# Patient Record
Sex: Male | Born: 1970 | Race: White | Hispanic: No | Marital: Single | Smoking: Current every day smoker
Health system: Southern US, Community
[De-identification: ages and names within clinical notes are randomized; demographics above are authoritative.]

## PROBLEM LIST (undated history)

## (undated) DIAGNOSIS — I10 Essential (primary) hypertension: Secondary | ICD-10-CM

## (undated) DIAGNOSIS — N200 Calculus of kidney: Secondary | ICD-10-CM

## (undated) HISTORY — PX: APPENDECTOMY: SHX54

---

## 2006-09-03 ENCOUNTER — Emergency Department: Payer: Self-pay | Admitting: Emergency Medicine

## 2006-10-16 ENCOUNTER — Emergency Department: Payer: Self-pay | Admitting: Internal Medicine

## 2009-01-27 ENCOUNTER — Emergency Department (HOSPITAL_COMMUNITY): Admission: EM | Admit: 2009-01-27 | Discharge: 2009-01-27 | Payer: Self-pay | Admitting: Emergency Medicine

## 2010-05-17 ENCOUNTER — Emergency Department (HOSPITAL_COMMUNITY): Admission: EM | Admit: 2010-05-17 | Discharge: 2010-05-17 | Payer: Self-pay | Admitting: Emergency Medicine

## 2010-08-21 ENCOUNTER — Emergency Department (HOSPITAL_COMMUNITY)
Admission: EM | Admit: 2010-08-21 | Discharge: 2010-08-21 | Payer: Self-pay | Source: Home / Self Care | Admitting: Emergency Medicine

## 2010-09-13 ENCOUNTER — Emergency Department (HOSPITAL_COMMUNITY)
Admission: EM | Admit: 2010-09-13 | Discharge: 2010-09-13 | Payer: Self-pay | Source: Home / Self Care | Admitting: Emergency Medicine

## 2010-09-20 LAB — URINALYSIS, ROUTINE W REFLEX MICROSCOPIC
Bilirubin Urine: NEGATIVE
Ketones, ur: NEGATIVE mg/dL
Leukocytes, UA: NEGATIVE
Nitrite: NEGATIVE
Protein, ur: NEGATIVE mg/dL
Specific Gravity, Urine: 1.025 (ref 1.005–1.030)
Urine Glucose, Fasting: NEGATIVE mg/dL
Urobilinogen, UA: 0.2 mg/dL (ref 0.0–1.0)
pH: 6 (ref 5.0–8.0)

## 2010-09-20 LAB — URINE MICROSCOPIC-ADD ON

## 2010-12-14 LAB — CBC
HCT: 43 % (ref 39.0–52.0)
Hemoglobin: 15.5 g/dL (ref 13.0–17.0)
MCHC: 36.1 g/dL — ABNORMAL HIGH (ref 30.0–36.0)
MCV: 94.3 fL (ref 78.0–100.0)
Platelets: 182 10*3/uL (ref 150–400)
RBC: 4.56 MIL/uL (ref 4.22–5.81)
RDW: 13.7 % (ref 11.5–15.5)
WBC: 7.5 10*3/uL (ref 4.0–10.5)

## 2010-12-14 LAB — DIFFERENTIAL
Basophils Absolute: 0 10*3/uL (ref 0.0–0.1)
Basophils Relative: 0 % (ref 0–1)
Eosinophils Absolute: 0.3 10*3/uL (ref 0.0–0.7)
Eosinophils Relative: 4 % (ref 0–5)
Lymphocytes Relative: 24 % (ref 12–46)
Lymphs Abs: 1.8 10*3/uL (ref 0.7–4.0)
Monocytes Absolute: 0.6 10*3/uL (ref 0.1–1.0)
Monocytes Relative: 8 % (ref 3–12)
Neutro Abs: 4.8 10*3/uL (ref 1.7–7.7)
Neutrophils Relative %: 64 % (ref 43–77)

## 2010-12-14 LAB — COMPREHENSIVE METABOLIC PANEL
ALT: 79 U/L — ABNORMAL HIGH (ref 0–53)
AST: 46 U/L — ABNORMAL HIGH (ref 0–37)
Albumin: 3.8 g/dL (ref 3.5–5.2)
Alkaline Phosphatase: 57 U/L (ref 39–117)
BUN: 10 mg/dL (ref 6–23)
CO2: 29 mEq/L (ref 19–32)
Calcium: 9.1 mg/dL (ref 8.4–10.5)
Chloride: 107 mEq/L (ref 96–112)
Creatinine, Ser: 0.86 mg/dL (ref 0.4–1.5)
GFR calc Af Amer: >60 mL/min (ref >60)
GFR calc non Af Amer: >60 mL/min (ref >60)
Glucose, Bld: 79 mg/dL (ref 70–99)
Potassium: 3.9 mEq/L (ref 3.5–5.1)
Sodium: 142 mEq/L (ref 135–145)
Total Bilirubin: 0.4 mg/dL (ref 0.3–1.2)
Total Protein: 6.5 g/dL (ref 6.0–8.3)

## 2010-12-14 LAB — POCT CARDIAC MARKERS
CKMB, poc: <1 ng/mL — ABNORMAL LOW (ref 1.0–8.0)
Myoglobin, poc: 42.1 ng/mL (ref 12–200)
Troponin i, poc: <0.05 ng/mL (ref 0.00–0.09)

## 2011-01-05 ENCOUNTER — Emergency Department (HOSPITAL_COMMUNITY): Payer: Self-pay

## 2011-01-05 ENCOUNTER — Emergency Department (HOSPITAL_COMMUNITY)
Admission: EM | Admit: 2011-01-05 | Discharge: 2011-01-05 | Disposition: A | Discharge status: Home / Self Care | Payer: Self-pay | Attending: Emergency Medicine | Admitting: Emergency Medicine

## 2011-01-05 DIAGNOSIS — M542 Cervicalgia: Secondary | ICD-10-CM | POA: Insufficient documentation

## 2011-01-05 DIAGNOSIS — Z87442 Personal history of urinary calculi: Secondary | ICD-10-CM | POA: Insufficient documentation

## 2011-07-27 ENCOUNTER — Ambulatory Visit (HOSPITAL_COMMUNITY)
Admission: RE | Admit: 2011-07-27 | Discharge: 2011-07-27 | Disposition: A | Discharge status: Home / Self Care | Payer: BC Managed Care – PPO | Source: Ambulatory Visit | Attending: Internal Medicine | Admitting: Internal Medicine

## 2011-07-27 ENCOUNTER — Other Ambulatory Visit (HOSPITAL_COMMUNITY): Payer: Self-pay | Admitting: Internal Medicine

## 2011-07-27 DIAGNOSIS — M549 Dorsalgia, unspecified: Secondary | ICD-10-CM

## 2011-07-27 DIAGNOSIS — M546 Pain in thoracic spine: Secondary | ICD-10-CM | POA: Insufficient documentation

## 2011-07-27 DIAGNOSIS — M545 Low back pain, unspecified: Secondary | ICD-10-CM | POA: Insufficient documentation

## 2012-02-22 ENCOUNTER — Emergency Department: Payer: Self-pay | Admitting: Emergency Medicine

## 2012-02-22 LAB — CBC
HCT: 45.9 % (ref 40.0–52.0)
HGB: 15.4 g/dL (ref 13.0–18.0)
MCH: 32.3 pg (ref 26.0–34.0)
MCHC: 33.7 g/dL (ref 32.0–36.0)
MCV: 96 fL (ref 80–100)
Platelet: 192 10*3/uL (ref 150–440)
RBC: 4.78 10*6/uL (ref 4.40–5.90)
RDW: 13.5 % (ref 11.5–14.5)
WBC: 7.5 10*3/uL (ref 3.8–10.6)

## 2012-02-22 LAB — BASIC METABOLIC PANEL
Anion Gap: 7 (ref 7–16)
BUN: 17 mg/dL (ref 7–18)
Calcium, Total: 9 mg/dL (ref 8.5–10.1)
Chloride: 110 mmol/L — ABNORMAL HIGH (ref 98–107)
Co2: 25 mmol/L (ref 21–32)
Creatinine: 0.7 mg/dL (ref 0.60–1.30)
EGFR (African American): >60
EGFR (Non-African Amer.): >60
Glucose: 89 mg/dL (ref 65–99)
Osmolality: 284 (ref 275–301)
Potassium: 3.6 mmol/L (ref 3.5–5.1)
Sodium: 142 mmol/L (ref 136–145)

## 2012-02-22 LAB — TROPONIN I: Troponin-I: <0.02 ng/mL

## 2012-02-22 LAB — CK TOTAL AND CKMB (NOT AT ARMC)
CK, Total: 98 U/L (ref 35–232)
CK-MB: 1 ng/mL (ref 0.5–3.6)

## 2012-05-06 ENCOUNTER — Emergency Department: Payer: Self-pay | Admitting: Emergency Medicine

## 2012-05-11 ENCOUNTER — Emergency Department: Payer: Self-pay | Admitting: Emergency Medicine

## 2012-11-25 ENCOUNTER — Emergency Department: Payer: Self-pay | Admitting: Unknown Physician Specialty

## 2013-01-25 ENCOUNTER — Emergency Department: Payer: Self-pay | Admitting: Emergency Medicine

## 2013-01-28 ENCOUNTER — Emergency Department: Payer: Self-pay | Admitting: Emergency Medicine

## 2013-01-28 LAB — COMPREHENSIVE METABOLIC PANEL
Albumin: 3.6 g/dL (ref 3.4–5.0)
Alkaline Phosphatase: 74 U/L (ref 50–136)
Anion Gap: 6 — ABNORMAL LOW (ref 7–16)
BUN: 14 mg/dL (ref 7–18)
Bilirubin,Total: 0.1 mg/dL — ABNORMAL LOW (ref 0.2–1.0)
Calcium, Total: 8.5 mg/dL (ref 8.5–10.1)
Chloride: 111 mmol/L — ABNORMAL HIGH (ref 98–107)
Co2: 27 mmol/L (ref 21–32)
Creatinine: 0.72 mg/dL (ref 0.60–1.30)
EGFR (African American): >60
EGFR (Non-African Amer.): >60
Glucose: 90 mg/dL (ref 65–99)
Osmolality: 287 (ref 275–301)
Potassium: 3.7 mmol/L (ref 3.5–5.1)
SGOT(AST): 35 U/L (ref 15–37)
SGPT (ALT): 60 U/L (ref 12–78)
Sodium: 144 mmol/L (ref 136–145)
Total Protein: 6.5 g/dL (ref 6.4–8.2)

## 2013-01-28 LAB — CBC
HCT: 44.1 % (ref 40.0–52.0)
HGB: 15.3 g/dL (ref 13.0–18.0)
MCH: 32 pg (ref 26.0–34.0)
MCHC: 34.7 g/dL (ref 32.0–36.0)
MCV: 93 fL (ref 80–100)
Platelet: 194 10*3/uL (ref 150–440)
RBC: 4.77 10*6/uL (ref 4.40–5.90)
RDW: 13.3 % (ref 11.5–14.5)
WBC: 8.1 10*3/uL (ref 3.8–10.6)

## 2013-01-28 LAB — URINALYSIS, COMPLETE
Bacteria: NONE SEEN
Bilirubin,UR: NEGATIVE
Glucose,UR: NEGATIVE mg/dL (ref 0–75)
Ketone: NEGATIVE
Leukocyte Esterase: NEGATIVE
Nitrite: NEGATIVE
Ph: 6 (ref 4.5–8.0)
Protein: NEGATIVE
RBC,UR: 2 /HPF (ref 0–5)
Specific Gravity: 1.005 (ref 1.003–1.030)
Squamous Epithelial: NONE SEEN
WBC UR: NONE SEEN /HPF (ref 0–5)

## 2013-05-26 ENCOUNTER — Emergency Department: Payer: Self-pay | Admitting: Emergency Medicine

## 2013-05-26 LAB — URINALYSIS, COMPLETE
Bacteria: NONE SEEN
Bilirubin,UR: NEGATIVE
Glucose,UR: NEGATIVE mg/dL (ref 0–75)
Ketone: NEGATIVE
Leukocyte Esterase: NEGATIVE
Nitrite: NEGATIVE
Ph: 7 (ref 4.5–8.0)
Protein: NEGATIVE
RBC,UR: 18 /HPF (ref 0–5)
Specific Gravity: 1.011 (ref 1.003–1.030)
Squamous Epithelial: NONE SEEN
WBC UR: 1 /HPF (ref 0–5)

## 2013-05-26 LAB — COMPREHENSIVE METABOLIC PANEL
Albumin: 4 g/dL (ref 3.4–5.0)
Alkaline Phosphatase: 71 U/L (ref 50–136)
Anion Gap: 6 — ABNORMAL LOW (ref 7–16)
BUN: 14 mg/dL (ref 7–18)
Bilirubin,Total: 0.2 mg/dL (ref 0.2–1.0)
Calcium, Total: 8.9 mg/dL (ref 8.5–10.1)
Chloride: 108 mmol/L — ABNORMAL HIGH (ref 98–107)
Co2: 26 mmol/L (ref 21–32)
Creatinine: 0.85 mg/dL (ref 0.60–1.30)
EGFR (African American): >60
EGFR (Non-African Amer.): >60
Glucose: 112 mg/dL — ABNORMAL HIGH (ref 65–99)
Osmolality: 281 (ref 275–301)
Potassium: 3.2 mmol/L — ABNORMAL LOW (ref 3.5–5.1)
SGOT(AST): 21 U/L (ref 15–37)
SGPT (ALT): 27 U/L (ref 12–78)
Sodium: 140 mmol/L (ref 136–145)
Total Protein: 6.8 g/dL (ref 6.4–8.2)

## 2013-05-26 LAB — CBC
HCT: 44.6 % (ref 40.0–52.0)
HGB: 15.8 g/dL (ref 13.0–18.0)
MCH: 33.2 pg (ref 26.0–34.0)
MCHC: 35.4 g/dL (ref 32.0–36.0)
MCV: 94 fL (ref 80–100)
Platelet: 183 10*3/uL (ref 150–440)
RBC: 4.75 10*6/uL (ref 4.40–5.90)
RDW: 13.5 % (ref 11.5–14.5)
WBC: 10.7 10*3/uL — ABNORMAL HIGH (ref 3.8–10.6)

## 2013-05-26 LAB — LIPASE, BLOOD: Lipase: 191 U/L (ref 73–393)

## 2013-12-08 ENCOUNTER — Emergency Department: Payer: Self-pay | Admitting: Emergency Medicine

## 2013-12-08 LAB — CBC WITH DIFFERENTIAL/PLATELET
Basophil #: 0 10*3/uL (ref 0.0–0.1)
Basophil %: 0.7 %
Eosinophil #: 0.5 10*3/uL (ref 0.0–0.7)
Eosinophil %: 6.5 %
HCT: 44 % (ref 40.0–52.0)
HGB: 14.9 g/dL (ref 13.0–18.0)
Lymphocyte #: 1.9 10*3/uL (ref 1.0–3.6)
Lymphocyte %: 27.1 %
MCH: 31.7 pg (ref 26.0–34.0)
MCHC: 34 g/dL (ref 32.0–36.0)
MCV: 94 fL (ref 80–100)
Monocyte #: 0.8 x10 3/mm (ref 0.2–1.0)
Monocyte %: 11.7 %
Neutrophil #: 3.8 10*3/uL (ref 1.4–6.5)
Neutrophil %: 54 %
Platelet: 177 10*3/uL (ref 150–440)
RBC: 4.71 10*6/uL (ref 4.40–5.90)
RDW: 13.3 % (ref 11.5–14.5)
WBC: 7.1 10*3/uL (ref 3.8–10.6)

## 2013-12-08 LAB — URINALYSIS, COMPLETE
Bacteria: NONE SEEN
Bilirubin,UR: NEGATIVE
Glucose,UR: NEGATIVE mg/dL (ref 0–75)
Ketone: NEGATIVE
Leukocyte Esterase: NEGATIVE
Nitrite: NEGATIVE
Ph: 7 (ref 4.5–8.0)
Protein: NEGATIVE
RBC,UR: 14 /HPF (ref 0–5)
Specific Gravity: 1.01 (ref 1.003–1.030)
Squamous Epithelial: NONE SEEN
WBC UR: <1 /HPF (ref 0–5)

## 2013-12-08 LAB — TROPONIN I: Troponin-I: <0.02 ng/mL

## 2013-12-08 LAB — BASIC METABOLIC PANEL
Anion Gap: 6 — ABNORMAL LOW (ref 7–16)
BUN: 12 mg/dL (ref 7–18)
Calcium, Total: 8.4 mg/dL — ABNORMAL LOW (ref 8.5–10.1)
Chloride: 107 mmol/L (ref 98–107)
Co2: 28 mmol/L (ref 21–32)
Creatinine: 0.86 mg/dL (ref 0.60–1.30)
EGFR (African American): >60
EGFR (Non-African Amer.): >60
Glucose: 86 mg/dL (ref 65–99)
Osmolality: 280 (ref 275–301)
Potassium: 3.7 mmol/L (ref 3.5–5.1)
Sodium: 141 mmol/L (ref 136–145)

## 2013-12-23 ENCOUNTER — Emergency Department: Payer: Self-pay | Admitting: Emergency Medicine

## 2013-12-23 LAB — BASIC METABOLIC PANEL
Anion Gap: 5 — ABNORMAL LOW (ref 7–16)
BUN: 16 mg/dL (ref 7–18)
Calcium, Total: 8.2 mg/dL — ABNORMAL LOW (ref 8.5–10.1)
Chloride: 111 mmol/L — ABNORMAL HIGH (ref 98–107)
Co2: 26 mmol/L (ref 21–32)
Creatinine: 0.73 mg/dL (ref 0.60–1.30)
EGFR (African American): >60
EGFR (Non-African Amer.): >60
Glucose: 97 mg/dL (ref 65–99)
Osmolality: 284 (ref 275–301)
Potassium: 3.6 mmol/L (ref 3.5–5.1)
Sodium: 142 mmol/L (ref 136–145)

## 2013-12-23 LAB — URINALYSIS, COMPLETE
Bacteria: NONE SEEN
Bilirubin,UR: NEGATIVE
Glucose,UR: NEGATIVE mg/dL (ref 0–75)
Ketone: NEGATIVE
Leukocyte Esterase: NEGATIVE
Nitrite: NEGATIVE
Ph: 5 (ref 4.5–8.0)
Protein: NEGATIVE
RBC,UR: 19 /HPF (ref 0–5)
Specific Gravity: 1.016 (ref 1.003–1.030)
Squamous Epithelial: NONE SEEN
WBC UR: <1 /HPF (ref 0–5)

## 2013-12-23 LAB — DRUG SCREEN, URINE

## 2013-12-23 LAB — CBC WITH DIFFERENTIAL/PLATELET
Basophil #: 0 10*3/uL (ref 0.0–0.1)
Basophil %: 0.3 %
Eosinophil #: 0.4 10*3/uL (ref 0.0–0.7)
Eosinophil %: 2.8 %
HCT: 46.9 % (ref 40.0–52.0)
HGB: 15.5 g/dL (ref 13.0–18.0)
Lymphocyte #: 3.4 10*3/uL (ref 1.0–3.6)
Lymphocyte %: 26 %
MCH: 30.9 pg (ref 26.0–34.0)
MCHC: 32.9 g/dL (ref 32.0–36.0)
MCV: 94 fL (ref 80–100)
Monocyte #: 1 x10 3/mm (ref 0.2–1.0)
Monocyte %: 7.8 %
Neutrophil #: 8.2 10*3/uL — ABNORMAL HIGH (ref 1.4–6.5)
Neutrophil %: 63.1 %
Platelet: 224 10*3/uL (ref 150–440)
RBC: 5 10*6/uL (ref 4.40–5.90)
RDW: 13.4 % (ref 11.5–14.5)
WBC: 13 10*3/uL — ABNORMAL HIGH (ref 3.8–10.6)

## 2013-12-23 LAB — TROPONIN I: Troponin-I: <0.02 ng/mL

## 2013-12-23 LAB — CK: CK, Total: 44 U/L

## 2014-09-23 ENCOUNTER — Emergency Department: Payer: Self-pay | Admitting: Emergency Medicine

## 2014-10-15 ENCOUNTER — Emergency Department: Payer: Self-pay | Admitting: Emergency Medicine

## 2014-10-29 ENCOUNTER — Emergency Department: Payer: Self-pay | Admitting: Emergency Medicine

## 2014-12-04 ENCOUNTER — Emergency Department: Admit: 2014-12-04 | Disposition: A | Discharge status: Home / Self Care | Payer: Self-pay | Admitting: Student

## 2014-12-04 LAB — BASIC METABOLIC PANEL
Anion Gap: 7 (ref 7–16)
BUN: 14 mg/dL
Calcium, Total: 8.9 mg/dL
Chloride: 105 mmol/L
Co2: 24 mmol/L
Creatinine: 0.93 mg/dL
EGFR (African American): >60
EGFR (Non-African Amer.): >60
Glucose: 94 mg/dL
Potassium: 3.4 mmol/L — ABNORMAL LOW
Sodium: 136 mmol/L

## 2014-12-04 LAB — CBC
HCT: 45.9 % (ref 40.0–52.0)
HGB: 15.9 g/dL (ref 13.0–18.0)
MCH: 32.2 pg (ref 26.0–34.0)
MCHC: 34.7 g/dL (ref 32.0–36.0)
MCV: 93 fL (ref 80–100)
Platelet: 197 10*3/uL (ref 150–440)
RBC: 4.94 10*6/uL (ref 4.40–5.90)
RDW: 13.4 % (ref 11.5–14.5)
WBC: 8.6 10*3/uL (ref 3.8–10.6)

## 2014-12-04 LAB — TROPONIN I: Troponin-I: <0.03 ng/mL

## 2015-02-26 ENCOUNTER — Emergency Department: Payer: BLUE CROSS/BLUE SHIELD

## 2015-02-26 ENCOUNTER — Emergency Department
Admission: EM | Admit: 2015-02-26 | Discharge: 2015-02-26 | Disposition: A | Discharge status: Home / Self Care | Payer: BLUE CROSS/BLUE SHIELD | Attending: Emergency Medicine | Admitting: Emergency Medicine

## 2015-02-26 ENCOUNTER — Encounter: Payer: Self-pay | Admitting: *Deleted

## 2015-02-26 DIAGNOSIS — S8981XA Other specified injuries of right lower leg, initial encounter: Secondary | ICD-10-CM

## 2015-02-26 DIAGNOSIS — Y998 Other external cause status: Secondary | ICD-10-CM | POA: Insufficient documentation

## 2015-02-26 DIAGNOSIS — S8991XA Unspecified injury of right lower leg, initial encounter: Secondary | ICD-10-CM | POA: Insufficient documentation

## 2015-02-26 DIAGNOSIS — Y9389 Activity, other specified: Secondary | ICD-10-CM | POA: Diagnosis not present

## 2015-02-26 DIAGNOSIS — W1789XA Other fall from one level to another, initial encounter: Secondary | ICD-10-CM | POA: Diagnosis not present

## 2015-02-26 DIAGNOSIS — Y9289 Other specified places as the place of occurrence of the external cause: Secondary | ICD-10-CM | POA: Insufficient documentation

## 2015-02-26 MED ORDER — TRAMADOL HCL 50 MG PO TABS
ORAL_TABLET | ORAL | Status: AC
  Filled 2015-02-26: qty 1

## 2015-02-26 MED ORDER — IBUPROFEN 800 MG PO TABS
ORAL_TABLET | ORAL | Status: DC
Start: 2015-02-26 — End: 2015-02-26
  Filled 2015-02-26: qty 1

## 2015-02-26 MED ORDER — TRAMADOL HCL 50 MG PO TABS
50.0000 mg | ORAL_TABLET | Freq: Once | ORAL | Status: AC
  Administered 2015-02-26: 50 mg via ORAL

## 2015-02-26 MED ORDER — IBUPROFEN 800 MG PO TABS: 800.0000 mg | ORAL_TABLET | Freq: Three times a day (TID) | ORAL | Status: DC | PRN

## 2015-02-26 MED ORDER — TRAMADOL HCL 50 MG PO TABS: 50.0000 mg | ORAL_TABLET | Freq: Four times a day (QID) | ORAL | Status: DC | PRN

## 2015-02-26 MED ORDER — IBUPROFEN 800 MG PO TABS
800.0000 mg | ORAL_TABLET | Freq: Once | ORAL | Status: AC
  Administered 2015-02-26: 800 mg via ORAL

## 2015-02-26 NOTE — ED Provider Notes (Signed)
[name] Emergency Department Provider Note  ____________________________________________  Time seen: Approximately 7:24 PM  I have reviewed the triage vital signs and the nursing notes.   HISTORY  Chief Complaint Knee Pain    HPI Douglas Barton is a 44 y.o. male patient complained of right knee pain secondary falling 5 feet from a landing. Patient stated he felt like the knee hyperextended and felt a popping sensation. Patient states pain with extension of the knee. Patient rates his pain at 6/10 upon extension of the knee. Able to bear weight. No palliative measures except for icing immediately after the incident..  History reviewed. No pertinent past medical history.  There are no active problems to display for this patient.   Past Surgical History  Procedure Laterality Date  . Appendectomy      Current Outpatient Rx  Name  Route  Sig  Dispense  Refill  . ibuprofen (ADVIL,MOTRIN) 800 MG tablet   Oral   Take 1 tablet (800 mg total) by mouth every 8 (eight) hours as needed for moderate pain.   15 tablet   0   . traMADol (ULTRAM) 50 MG tablet   Oral   Take 1 tablet (50 mg total) by mouth every 6 (six) hours as needed for moderate pain.   12 tablet   0     Allergies Review of patient's allergies indicates no known allergies.  No family history on file.  Social History History  Substance Use Topics  . Smoking status: Current Every Day Smoker -- 2.00 packs/day  . Smokeless tobacco: Not on file  . Alcohol Use: No    Review of Systems Constitutional: No fever/chills Eyes: No visual changes. ENT: No sore throat. Cardiovascular: Denies chest pain. Respiratory: Denies shortness of breath. Gastrointestinal: No abdominal pain.  No nausea, no vomiting.  No diarrhea.  No constipation. Genitourinary: Negative for dysuria. Musculoskeletal: Right knee pain Skin: Negative for rash. Neurological: Negative for headaches, focal weakness or  numbness. 10-point ROS otherwise negative.  ____________________________________________   PHYSICAL EXAM:  VITAL SIGNS: ED Triage Vitals  Enc Vitals Group     BP 02/26/15 1835 152/105 mmHg     Pulse Rate 02/26/15 1835 74     Resp 02/26/15 1835 20     Temp 02/26/15 1835 98.2 F (36.8 C)     Temp Source 02/26/15 1835 Oral     SpO2 02/26/15 1835 97 %     Weight 02/26/15 1835 172 lb (78.019 kg)     Height 02/26/15 1835  (1.727 m)     Head Cir --      Peak Flow --      Pain Score 02/26/15 1835 6     Pain Loc --      Pain Edu? --      Excl. in GC? --     Constitutional: Alert and oriented. All distress Eyes: Conjunctivae are normal. PERRL. EOMI. Head: Atraumatic. Nose: No congestion/rhinnorhea. Mouth/Throat: Mucous membranes are moist.  Oropharynx non-erythematous. Neck: No stridor.  No cervical spine tenderness to palpation Hematological/Lymphatic/Immunilogical: No cervical lymphadenopathy. Cardiovascular: Normal rate, regular rhythm. Grossly normal heart sounds.  Good peripheral circulation. Mild elevation of BP. Respiratory: Normal respiratory effort.  No retractions. Lungs CTAB. Gastrointestinal: Soft and nontender. No distention. No abdominal bruits. No CVA tenderness. Musculoskeletal: No obvious deformity or edema of the right knee. Decreased range of motion fixation secondary to complain of pain. Neurovascular intact. No crepitus with tenderness to palpation anterior patella. Neurologic:  Normal speech and language. No gross focal neurologic deficits are appreciated. Speech is normal. No gait instability. Skin:  Skin is warm, dry and intact. No rash noted. Psychiatric: Mood and affect are normal. Speech and behavior are normal.  ____________________________________________   LABS (all labs ordered are listed, but only abnormal results are displayed)  Labs Reviewed - No data to  display ____________________________________________  EKG   ____________________________________________  RADIOLOGY  I, Joni Reining, personally viewed and evaluated these images as part of my medical decision making.  No acute findings. ____________________________________________   PROCEDURES  Procedure(s) performed: None  Critical Care performed: No  ____________________________________________   INITIAL IMPRESSION / ASSESSMENT AND PLAN / ED COURSE  Pertinent labs & imaging results that were available during my care of the patient were reviewed by me and considered in my medical decision making (see chart for details).  Hyperextension injury to the right knee. Patient was placed in a knee immobilizer. Patient was given a prescription for tramadol and ibuprofen. Patient advised to follow with orthopedic if no improvement in the next 3-5 days. ____________________________________________   FINAL CLINICAL IMPRESSION(S) / ED DIAGNOSES  Final diagnoses:  Hyperextension injury of right knee, initial encounter      Joni Reining, PA-C 02/26/15 2010  Myrna Blazer, MD 02/26/15 [id number]

## 2015-02-26 NOTE — ED Notes (Signed)
Pt fell off of a flat bed truck today and his knee popped and pt has been having right knee pain since.  Pt ambulatory to triage

## 2015-02-26 NOTE — Discharge Instructions (Signed)
Wear knee support for 2-3 days as needed.

## 2015-02-26 NOTE — ED Notes (Signed)
Pt reports fall from 5 ft landing on right leg standing.  Pt reports feeling pop in right knee upon landing.  Pt reports able to bear weight and walk, but difficulty extending knee.  No swelling or bruising noted at this time.  Pt NAD at this time

## 2015-03-25 ENCOUNTER — Emergency Department
Admission: EM | Admit: 2015-03-25 | Discharge: 2015-03-25 | Disposition: A | Discharge status: Home / Self Care | Payer: BLUE CROSS/BLUE SHIELD | Attending: Emergency Medicine | Admitting: Emergency Medicine

## 2015-03-25 ENCOUNTER — Emergency Department: Payer: BLUE CROSS/BLUE SHIELD

## 2015-03-25 ENCOUNTER — Other Ambulatory Visit: Payer: Self-pay

## 2015-03-25 DIAGNOSIS — R0789 Other chest pain: Secondary | ICD-10-CM

## 2015-03-25 DIAGNOSIS — Z720 Tobacco use: Secondary | ICD-10-CM | POA: Insufficient documentation

## 2015-03-25 DIAGNOSIS — R0602 Shortness of breath: Secondary | ICD-10-CM | POA: Diagnosis present

## 2015-03-25 DIAGNOSIS — R0600 Dyspnea, unspecified: Secondary | ICD-10-CM | POA: Diagnosis not present

## 2015-03-25 LAB — COMPREHENSIVE METABOLIC PANEL
ALBUMIN: 4.2 g/dL (ref 3.5–5.0)
ALT: 34 U/L (ref 17–63)
ANION GAP: 8 (ref 5–15)
AST: 25 U/L (ref 15–41)
Alkaline Phosphatase: 63 U/L (ref 38–126)
BILIRUBIN TOTAL: 0.3 mg/dL (ref 0.3–1.2)
BUN: 18 mg/dL (ref 6–20)
CO2: 23 mmol/L (ref 22–32)
CREATININE: 0.92 mg/dL (ref 0.61–1.24)
Calcium: 8.8 mg/dL — ABNORMAL LOW (ref 8.9–10.3)
Chloride: 110 mmol/L (ref 101–111)
GFR calc Af Amer: >60 mL/min (ref >60)
GFR calc non Af Amer: >60 mL/min (ref >60)
Glucose, Bld: 98 mg/dL (ref 65–99)
Potassium: 3.5 mmol/L (ref 3.5–5.1)
SODIUM: 141 mmol/L (ref 135–145)
Total Protein: 7.1 g/dL (ref 6.5–8.1)

## 2015-03-25 LAB — CBC
HEMATOCRIT: 46 % (ref 40.0–52.0)
Hemoglobin: 15.7 g/dL (ref 13.0–18.0)
MCH: 31.5 pg (ref 26.0–34.0)
MCHC: 34.1 g/dL (ref 32.0–36.0)
MCV: 92.4 fL (ref 80.0–100.0)
Platelets: 203 10*3/uL (ref 150–440)
RBC: 4.98 MIL/uL (ref 4.40–5.90)
RDW: 13.7 % (ref 11.5–14.5)
WBC: 8.6 10*3/uL (ref 3.8–10.6)

## 2015-03-25 LAB — TROPONIN I: Troponin I: <0.03 ng/mL (ref <0.031)

## 2015-03-25 MED ORDER — ALBUTEROL SULFATE HFA 108 (90 BASE) MCG/ACT IN AERS: INHALATION_SPRAY | RESPIRATORY_TRACT | Status: DC

## 2015-03-25 NOTE — Discharge Instructions (Signed)
You have been seen in the Emergency Department (ED) today for chest pain.  As we have discussed todays test results are normal, but you may require further testing.  Please follow up with the recommended doctor as instructed above in these documents regarding todays emergent visit and your recent symptoms to discuss further management.  Your first step should be to set up a primary care doctor, either with Dr. Dareen PianoAnderson, one of his partners, or another primary care doctor of your choice.  Continue to take your regular medications, like we said, you may want to cut back on the [facility] powders given the high doses of aspirin they have.  Return to the Emergency Department (ED) if you experience any further chest pain/pressure/tightness, difficulty breathing, or sudden sweating, or other symptoms that concern you.   Chest Pain (Nonspecific) It is often hard to give a specific diagnosis for the cause of chest pain. There is always a chance that your pain could be related to something serious, such as a heart attack or a blood clot in the lungs. You need to follow up with your health care provider for further evaluation. CAUSES   Heartburn.  Pneumonia or bronchitis.  Anxiety or stress.  Inflammation around your heart (pericarditis) or lung (pleuritis or pleurisy).  A blood clot in the lung.  A collapsed lung (pneumothorax). It can develop suddenly on its own (spontaneous pneumothorax) or from trauma to the chest.  Shingles infection (herpes zoster virus). The chest wall is composed of bones, muscles, and cartilage. Any of these can be the source of the pain.  The bones can be bruised by injury.  The muscles or cartilage can be strained by coughing or overwork.  The cartilage can be affected by inflammation and become sore (costochondritis). DIAGNOSIS  Lab tests or other studies may be needed to find the cause of your pain. Your health care provider may have you take a test called an ambulatory  electrocardiogram (ECG). An ECG records your heartbeat patterns over a 24-hour period. You may also have other tests, such as:  Transthoracic echocardiogram (TTE). During echocardiography, sound waves are used to evaluate how blood flows through your heart.  Transesophageal echocardiogram (TEE).  Cardiac monitoring. This allows your health care provider to monitor your heart rate and rhythm in real time.  Holter monitor. This is a portable device that records your heartbeat and can help diagnose heart arrhythmias. It allows your health care provider to track your heart activity for several days, if needed.  Stress tests by exercise or by giving medicine that makes the heart beat faster. TREATMENT   Treatment depends on what may be causing your chest pain. Treatment may include:  Acid blockers for heartburn.  Anti-inflammatory medicine.  Pain medicine for inflammatory conditions.  Antibiotics if an infection is present.  You may be advised to change lifestyle habits. This includes stopping smoking and avoiding alcohol, caffeine, and chocolate.  You may be advised to keep your head raised (elevated) when sleeping. This reduces the chance of acid going backward from your stomach into your esophagus. Most of the time, nonspecific chest pain will improve within 2-3 days with rest and mild pain medicine.  HOME CARE INSTRUCTIONS   If antibiotics were prescribed, take them as directed. Finish them even if you start to feel better.  For the next few days, avoid physical activities that bring on chest pain. Continue physical activities as directed.  Do not use any tobacco products, including cigarettes, chewing tobacco, or  electronic cigarettes.  Avoid drinking alcohol.  Only take medicine as directed by your health care provider.  Follow your health care provider's suggestions for further testing if your chest pain does not go away.  Keep any follow-up appointments you made. If you do  not go to an appointment, you could develop lasting (chronic) problems with pain. If there is any problem keeping an appointment, call to reschedule. SEEK MEDICAL CARE IF:   Your chest pain does not go away, even after treatment.  You have a rash with blisters on your chest.  You have a fever. SEEK IMMEDIATE MEDICAL CARE IF:   You have increased chest pain or pain that spreads to your arm, neck, jaw, back, or abdomen.  You have shortness of breath.  You have an increasing cough, or you cough up blood.  You have severe back or abdominal pain.  You feel nauseous or vomit.  You have severe weakness.  You faint.  You have chills. This is an emergency. Do not wait to see if the pain will go away. Get medical help at once. Call your local emergency services (911 in [place] Do not drive yourself to the hospital. MAKE SURE YOU:   Understand these instructions.  Will watch your condition.  Will get help right away if you are not doing well or get worse. Document Released: 06/01/2005 Document Revised: 08/27/2013 Document Reviewed: 03/27/2008 [facility] Patient Information 2015 [place], [place]. This information is not intended to replace advice given to you by your health care provider. Make sure you discuss any questions you have with your health care provider.

## 2015-03-25 NOTE — ED Provider Notes (Signed)
[facility] Emergency Department Provider Note  ____________________________________________  Time seen: Approximately 2:37 PM  I have reviewed the triage vital signs and the nursing notes.   HISTORY  Chief Complaint Shortness of Breath    HPI Douglas Barton is a 44 y.o. male with a medical history mostly significant for tobacco abuseand with no primary care doctor who presents with several weeks of intermittent shortness of breath and occasional pressure and sharp pain in his chest.  He reports that the shortness of breath is worse in the hot and humid weather and with exertion.  The chest pain tends to occur at night when he has tried to go to sleep.  It comes on very quickly but resolves within seconds.  It does not radiate anywhere including no radiation to the back.  The severity is moderate.  He is currently chest pain-free.  Anxiety and exertion seems to make it worse and nothing seems to make it better.  He does not specifically take aspirin, but he takes multiple BC powders each day (I counseled them about reducing the number given the high amount of aspirin in each [facility] powder).  He denies nausea, vomiting, abdominal pain, dysuria.  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   History reviewed. No pertinent past medical history.  There are no active problems to display for this patient.   Past Surgical History  Procedure Laterality Date  . Appendectomy      Current Outpatient Rx  Name  Route  Sig  Dispense  Refill  . albuterol (PROVENTIL HFA;VENTOLIN HFA) 108 (90 BASE) MCG/ACT inhaler      Inhale 4-6 puffs by mouth every 4 hours as needed for wheezing, cough, and/or shortness of breath   1 Inhaler   1   . ibuprofen (ADVIL,MOTRIN) 800 MG tablet   Oral   Take 1 tablet (800 mg total) by mouth  every 8 (eight) hours as needed for moderate pain.   15 tablet   0   . traMADol (ULTRAM) 50 MG tablet   Oral   Take 1 tablet (50 mg total) by mouth every 6 (six) hours as needed for moderate pain.   12 tablet   0     Allergies Review of patient's allergies indicates no known allergies.  No family history on file.  Social History History  Substance Use Topics  . Smoking status: Current Every Day Smoker -- 2.00 packs/day    Types: Cigarettes  . Smokeless tobacco: Never Used  . Alcohol Use: No    Review of Systems Constitutional: No fever/chills Eyes: No visual changes. ENT: No sore throat. Cardiovascular: Occasional intermittent brief sharp chest pain. Respiratory: Intermittent/waxing and waning shortness of breath Gastrointestinal: No abdominal pain.  No nausea, no vomiting.  No diarrhea.  No constipation. Genitourinary: Negative for dysuria. Musculoskeletal: Negative for back pain.  Some left upper shoulder pain Skin: Negative for rash. Neurological: Negative for headaches, focal weakness or numbness.  10-point ROS otherwise negative.  ____________________________________________   PHYSICAL EXAM:  VITAL SIGNS: ED Triage Vitals  Enc Vitals Group     BP 03/25/15 1335 149/115 mmHg     Pulse Rate 03/25/15 1335 80     Resp 03/25/15 1335 18     Temp 03/25/15 1335 98.3 F (36.8 C)  Temp Source 03/25/15 1335 Oral     SpO2 03/25/15 1335 98 %     Weight 03/25/15 1335 180 lb (81.647 kg)     Height 03/25/15 1335 5\' 8"  (1.727 m)     Head Cir --      Peak Flow --      Pain Score 03/25/15 1336 4     Pain Loc --      Pain Edu? --      Excl. in GC? --     Constitutional: Alert and oriented. Well appearing and in no acute distress but appears tired. Eyes: Conjunctivae are slightly injected. PERRL. EOMI. Head: Atraumatic. Nose: No congestion/rhinnorhea. Mouth/Throat: Mucous membranes are moist.  Oropharynx non-erythematous. Neck: No stridor.   Cardiovascular:  Normal rate, regular rhythm. Grossly normal heart sounds.  Good peripheral circulation. Respiratory: Normal respiratory effort.  No retractions. Lungs CTAB.  No wheezing to suggest COPD exacerbation. Gastrointestinal: Soft and nontender. No distention. No abdominal bruits. No CVA tenderness. Musculoskeletal: No lower extremity tenderness nor edema.  No joint effusions. Neurologic:  Normal speech and language. No gross focal neurologic deficits are appreciated.  Skin:  Skin is warm, dry and intact. No rash noted. Psychiatric: Seems "down" but not clinically depressed. Speech and behavior are normal.    ____________________________________________   LABS (all labs ordered are listed, but only abnormal results are displayed)  Labs Reviewed  COMPREHENSIVE METABOLIC PANEL - Abnormal; Notable for the following:    Calcium 8.8 (*)    All other components within normal limits  CBC  TROPONIN I   ____________________________________________  EKG  ED ECG REPORT I, Reonna Finlayson, the attending physician, personally viewed and interpreted this ECG.  Date: 03/25/2015 EKG Time: 13:42 Rate: 77 Rhythm: normal sinus rhythm QRS Axis: normal Intervals: normal ST/T Wave abnormalities: normal Conduction Disutrbances: none Narrative Interpretation: unremarkable  ____________________________________________  RADIOLOGY  I, Sadao Weyer, personally viewed and evaluated these images as part of my medical decision making.   Dg Chest 2 View  03/25/2015   CLINICAL DATA:  44 year old male with shortness of breath and left side chest pain with inspiration for 1 week. Hypertension. Initial encounter.  EXAM: CHEST  2 VIEW  COMPARISON:  12/04/2014, and earlier.  FINDINGS: Lung volumes are stable and within normal limits. Normal cardiac size and mediastinal contours. Visualized tracheal air column is within normal limits. No pneumothorax, pulmonary edema, pleural effusion or confluent pulmonary opacity. No  acute osseous abnormality identified.  IMPRESSION: No acute cardiopulmonary abnormality.   Electronically Signed   By: Odessa Fleming M.D.   On: 03/25/2015 14:08   ____________________________________________   PROCEDURES  Procedure(s) performed: None  Critical Care performed: No ____________________________________________   INITIAL IMPRESSION / ASSESSMENT AND PLAN / ED COURSE  Pertinent labs & imaging results that were available during my care of the patient were reviewed by me and considered in my medical decision making (see chart for details).  The patient has a HEART score of one (for tobacco abuse) and is PERC negative (see below).  Nothing in his description, clinical presentation, or physical exam and workup make me concerned for an aortic abnormality causing his discomfort.  We talked for sometime about how busy he is and multiple social situations going on that are making him anxious, and he states that all his worry about several work and home related issues seems to be making his symptoms worse.  He also notes that they are worse at night when he has trying to go to  sleep.  We discussed his reassuring workup today but I strongly encouraged him to establish a primary care doctor for repeat blood pressure evaluation and possibly starting treatment if his diastolic remains high outside of the emergency department.  I explained why I would not recommend starting him on medication at this time after just 1 visit to the emergency department.  I will prescribe an albuterol inhaler which has helped him in the past though I explained to him he has no wheezing now and I do not believe he is having any sort of COPD exacerbation.  He states that he will definitely call to set up a primary care doctor and understands my usual and customary return precautions should his symptoms get worse.     Pulmonary Embolism Rule-out Criteria (PERC rule)                        If YES to ANY of the following,  the PERC rule is not satisfied and cannot be used to rule out PE in this patient (consider d-dimer or imaging depending on pre-test probability).                      If NO to ALL of the following, AND the clinician's pre-test probability is <15%, the [facility] rule is satisfied and there is no need for further workup (including no need to obtain a d-dimer) as the post-test probability of pulmonary embolism is <2%.                      Mnemonic is HAD CLOTS   H - hormone use (exogenous estrogen)      No. A - age > 50                                                 No. D - DVT/PE history                                      No.   C - coughing blood (hemoptysis)                 No. L - leg swelling, unilateral                             No. O - O2 Sat on Room Air < 95%                  No. T - tachycardia (HR ? 100)                         No. S - surgery or trauma, recent                      No.   Based on my evaluation of the patient, including application of this decision instrument, further testing to evaluate for pulmonary embolism is not indicated at this time. I have discussed this recommendation with the patient who states understanding and agreement with this plan.  ____________________________________________  FINAL CLINICAL IMPRESSION(S) / ED DIAGNOSES  Final diagnoses:  Dyspnea  Atypical chest pain  NEW MEDICATIONS STARTED DURING THIS VISIT:  New Prescriptions   ALBUTEROL (PROVENTIL HFA;VENTOLIN HFA) 108 (90 BASE) MCG/ACT INHALER    Inhale 4-6 puffs by mouth every 4 hours as needed for wheezing, cough, and/or shortness of breath     Loleta Rose, MD 03/25/15 1530

## 2015-03-25 NOTE — ED Notes (Signed)
Pt c/o SOB for the past weeks with pressure/tightness in chest, states "Im not sure if its because I smoke or if its just the humidity"

## 2015-03-27 ENCOUNTER — Encounter: Payer: Self-pay | Admitting: Medical Oncology

## 2015-03-27 ENCOUNTER — Emergency Department
Admission: EM | Admit: 2015-03-27 | Discharge: 2015-03-27 | Disposition: A | Discharge status: Home / Self Care | Payer: BLUE CROSS/BLUE SHIELD | Attending: Emergency Medicine | Admitting: Emergency Medicine

## 2015-03-27 DIAGNOSIS — M542 Cervicalgia: Secondary | ICD-10-CM | POA: Insufficient documentation

## 2015-03-27 DIAGNOSIS — H9203 Otalgia, bilateral: Secondary | ICD-10-CM | POA: Diagnosis not present

## 2015-03-27 DIAGNOSIS — Z720 Tobacco use: Secondary | ICD-10-CM | POA: Diagnosis not present

## 2015-03-27 DIAGNOSIS — R030 Elevated blood-pressure reading, without diagnosis of hypertension: Secondary | ICD-10-CM | POA: Diagnosis not present

## 2015-03-27 DIAGNOSIS — IMO0001 Reserved for inherently not codable concepts without codable children: Secondary | ICD-10-CM

## 2015-03-27 NOTE — Discharge Instructions (Signed)
Otalgia Otalgia is pain in or around the ear. When the pain is from the ear itself it is called primary otalgia. Pain may also be coming from somewhere else, like the head and neck. This is called secondary otalgia.  CAUSES  Causes of primary otalgia include:  Middle ear infection.  It can also be caused by injury to the ear or infection of the ear canal (swimmer's ear). Swimmer's ear causes pain, swelling and often drainage from the ear canal. Causes of secondary otalgia include:  Sinus infections.  Allergies and colds that cause stuffiness of the nose and tubes that drain the ears (eustachian tubes).  Dental problems like cavities, gum infections or teething.  Sore Throat (tonsillitis and pharyngitis).  Swollen glands in the neck.  Infection of the bone behind the ear (mastoiditis).  TMJ discomfort (problems with the joint between your jaw and your skull).  Other problems such as nerve disorders, circulation problems, heart disease and tumors of the head and neck can also cause symptoms of ear pain. This is rare. DIAGNOSIS  Evaluation, Diagnosis and Testing:  Examination by your medical caregiver is recommended to evaluate and diagnose the cause of otalgia.  Further testing or referral to a specialist may be indicated if the cause of the ear pain is not found and the symptom persists. TREATMENT   Your doctor may prescribe antibiotics if an ear infection is diagnosed.  Pain relievers and topical analgesics may be recommended.  It is important to take all medications as prescribed. HOME CARE INSTRUCTIONS   It may be helpful to sleep with the painful ear in the up position.  A warm compress over the painful ear may provide relief.  A soft diet and avoiding gum may help while ear pain is present. SEEK IMMEDIATE MEDICAL CARE IF:  You develop severe pain, a high fever, repeated vomiting or dehydration.  You develop extreme dizziness, headache, confusion, ringing in the  ears (tinnitus) or hearing loss. Document Released: 09/29/2004 Document Revised: 11/14/2011 Document Reviewed: 07/01/2009 [facility] Patient Information 2015 [place], [place]. This information is not intended to replace advice given to you by your health care provider. Make sure you discuss any questions you have with your health care provider.  General Headache Without Cause A general headache is pain or discomfort felt around the head or neck area. The cause may not be found.  HOME CARE   Keep all doctor visits.  Only take medicines as told by your doctor.  Lie down in a dark, quiet room when you have a headache.  Keep a journal to find out if certain things bring on headaches. For example, write down:  What you eat and drink.  How much sleep you get.  Any change to your diet or medicines.  Relax by getting a massage or doing other relaxing activities.  Put ice or heat packs on the head and neck area as told by your doctor.  Lessen stress.  Sit up straight. Do not tighten (tense) your muscles.  Quit smoking if you smoke.  Lessen how much alcohol you drink.  Lessen how much caffeine you drink, or stop drinking caffeine.  Eat and sleep on a regular schedule.  Get 7 to 9 hours of sleep, or as told by your doctor.  Keep lights dim if bright lights bother you or make your headaches worse. GET HELP RIGHT AWAY IF:   Your headache becomes really bad.  You have a fever.  You have a stiff neck.  You  have trouble seeing.  Your muscles are weak, or you lose muscle control.  You lose your balance or have trouble walking.  You feel like you will pass out (faint), or you pass out.  You have really bad symptoms that are different than your first symptoms.  You have problems with the medicines given to you by your doctor.  Your medicines do not work.  Your headache feels different than the other headaches.  You feel sick to your stomach (nauseous) or throw up  (vomit). MAKE SURE YOU:   Understand these instructions.  Will watch your condition.  Will get help right away if you are not doing well or get worse. Document Released: 05/31/2008 Document Revised: 11/14/2011 Document Reviewed: 08/12/2011 [facility] Patient Information 2015 [place], [place]. This information is not intended to replace advice given to you by your health care provider. Make sure you discuss any questions you have with your health care provider.  Managing Your High Blood Pressure Blood pressure is a measurement of how forceful your blood is pressing against the walls of the arteries. Arteries are muscular tubes within the circulatory system. Blood pressure does not stay the same. Blood pressure rises when you are active, excited, or nervous; and it lowers during sleep and relaxation. If the numbers measuring your blood pressure stay above normal most of the time, you are at risk for health problems. High blood pressure (hypertension) is a long-term (chronic) condition in which blood pressure is elevated. A blood pressure reading is recorded as two numbers, such as 120 over 80 (or 120/80). The first, higher number is called the systolic pressure. It is a measure of the pressure in your arteries as the heart beats. The second, lower number is called the diastolic pressure. It is a measure of the pressure in your arteries as the heart relaxes between beats.  Keeping your blood pressure in a normal range is important to your overall health and prevention of health problems, such as heart disease and stroke. When your blood pressure is uncontrolled, your heart has to work harder than normal. High blood pressure is a very common condition in adults because blood pressure tends to rise with age. Men and women are equally likely to have hypertension but at different times in life. Before age 3, men are more likely to have hypertension. After 45 years of age, women are more likely to have it.  Hypertension is especially common in African Americans. This condition often has no signs or symptoms. The cause of the condition is usually not known. Your caregiver can help you come up with a plan to keep your blood pressure in a normal, healthy range. BLOOD PRESSURE STAGES Blood pressure is classified into four stages: normal, prehypertension, stage 1, and stage 2. Your blood pressure reading will be used to determine what type of treatment, if any, is necessary. Appropriate treatment options are tied to these four stages:  Normal  Systolic pressure (mm Hg): below 120.  Diastolic pressure (mm Hg): below 80. Prehypertension  Systolic pressure (mm Hg): 120 to 139.  Diastolic pressure (mm Hg): 80 to 89. Stage1  Systolic pressure (mm Hg): 140 to 159.  Diastolic pressure (mm Hg): 90 to 99. Stage2  Systolic pressure (mm Hg): 160 or above.  Diastolic pressure (mm Hg): 100 or above. RISKS RELATED TO HIGH BLOOD PRESSURE Managing your blood pressure is an important responsibility. Uncontrolled high blood pressure can lead to:  A heart attack.  A stroke.  A weakened blood vessel (  aneurysm).  Heart failure.  Kidney damage.  Eye damage.  Metabolic syndrome.  Memory and concentration problems. HOW TO MANAGE YOUR BLOOD PRESSURE Blood pressure can be managed effectively with lifestyle changes and medicines (if needed). Your caregiver will help you come up with a plan to bring your blood pressure within a normal range. Your plan should include the following: Education  Read all information provided by your caregivers about how to control blood pressure.  Educate yourself on the latest guidelines and treatment recommendations. New research is always being done to further define the risks and treatments for high blood pressure. Lifestylechanges  Control your weight.  Avoid smoking.  Stay physically active.  Reduce the amount of salt in your diet.  Reduce  stress.  Control any chronic conditions, such as high cholesterol or diabetes.  Reduce your alcohol intake. Medicines  Several medicines (antihypertensive medicines) are available, if needed, to bring blood pressure within a normal range. Communication  Review all the medicines you take with your caregiver because there may be side effects or interactions.  Talk with your caregiver about your diet, exercise habits, and other lifestyle factors that may be contributing to high blood pressure.  See your caregiver regularly. Your caregiver can help you create and adjust your plan for managing high blood pressure. RECOMMENDATIONS FOR TREATMENT AND FOLLOW-UP  The following recommendations are based on current guidelines for managing high blood pressure in nonpregnant adults. Use these recommendations to identify the proper follow-up period or treatment option based on your blood pressure reading. You can discuss these options with your caregiver.  Systolic pressure of 120 to 139 or diastolic pressure of 80 to 89: Follow up with your caregiver as directed.  Systolic pressure of 140 to 160 or diastolic pressure of 90 to 100: Follow up with your caregiver within 2 months.  Systolic pressure above 160 or diastolic pressure above 100: Follow up with your caregiver within 1 month.  Systolic pressure above 180 or diastolic pressure above 110: Consider antihypertensive therapy; follow up with your caregiver within 1 week.  Systolic pressure above 200 or diastolic pressure above 120: Begin antihypertensive therapy; follow up with your caregiver within 1 week. Document Released: 05/16/2012 Document Reviewed: 05/16/2012 [facility] Patient Information 2015 [place], [place]. This information is not intended to replace advice given to you by your health care provider. Make sure you discuss any questions you have with your health care provider.  Blood Pressure Record Sheet Your blood pressure on this visit to  the emergency department or clinic is elevated. This does not necessarily mean you have high blood pressure (hypertension), but it does mean that your blood pressure needs to be rechecked. Many times your blood pressure can increase due to illness, pain, anxiety, or other factors. We recommend that you get a series of blood pressure readings done over a period of 5 days. It is best to get a reading in the morning and one in the evening. You should make sure to sit and relax for 1-5 minutes before the reading is taken. Write the readings down and make a follow-up appointment with your health care provider to discuss the results. If there is not a free clinic or a drug store with a blood-pressure-taking machine near you, you can purchase blood-pressure-taking equipment from a drug store. Having one in the home allows you the convenience of taking your blood pressure while you are home and relaxed.  Your blood pressure in the emergency department or clinic on ________ was  ____________________. BLOOD PRESSURE LOG Date: _______________________  a.m. _____________________  p.m. _____________________ Date: _______________________  a.m. _____________________  p.m. _____________________ Date: _______________________  a.m. _____________________  p.m. _____________________ Date: _______________________  a.m. _____________________  p.m. _____________________ Date: _______________________  a.m. _____________________  p.m. _____________________ Document Released: 05/21/2003 Document Revised: 01/06/2014 Document Reviewed: 10/15/2013 [employer] Patient Information 2015 [place], [place]. This information is not intended to replace advice given to you by your health care provider. Make sure you discuss any questions you have with your health care provider.  Consider dosing a daily allergy medicine (Clartin/Zyrtec/Allegra) for sinus symptoms. Take Tylenol as needed for headaches. Follow-up with Dr.  Gaynell Face as planned.  Record your blood pressure daily until that visit.

## 2015-03-27 NOTE — ED Provider Notes (Signed)
[facility] Emergency Department Provider Note ____________________________________________  Time seen: 1250  I have reviewed the triage vital signs and the nursing notes.  HISTORY  Chief Complaint  Otalgia and Neck Pain  HPI Douglas Barton is a 44 y.o. male reports to the ED for evaluation of left ear pressure and discomfort. He describes that the symptoms have been in the right ear as well but today of presenting for the last week in the left ear. He also reports some unrelated muscle tension in the back of the neck as well. He denies any recent fevers, chills, sweats, hearing changes, or otorrhea. He has dosed over-the-counter allergy medicine, without consistency, the last week. He denies any other symptoms at this time.  History reviewed. No pertinent past medical history.  There are no active problems to display for this patient.   Past Surgical History  Procedure Laterality Date  . Appendectomy      Current Outpatient Rx  Name  Route  Sig  Dispense  Refill  . albuterol (PROVENTIL HFA;VENTOLIN HFA) 108 (90 BASE) MCG/ACT inhaler      Inhale 4-6 puffs by mouth every 4 hours as needed for wheezing, cough, and/or shortness of breath   1 Inhaler   1   . ibuprofen (ADVIL,MOTRIN) 800 MG tablet   Oral   Take 1 tablet (800 mg total) by mouth every 8 (eight) hours as needed for moderate pain.   15 tablet   0   . traMADol (ULTRAM) 50 MG tablet   Oral   Take 1 tablet (50 mg total) by mouth every 6 (six) hours as needed for moderate pain.   12 tablet   0    Allergies Review of patient's allergies indicates no known allergies.  No family history on file.  Social History History  Substance Use Topics  . Smoking status: Current Every Day Smoker -- 2.00 packs/day    Types: Cigarettes  . Smokeless tobacco: Never Used  . Alcohol Use: No   Review of Systems  Constitutional: Negative for fever. Eyes: Negative for visual changes. ENT: Negative for  sore throat. Reports ear pressures. Cardiovascular: Negative for chest pain. Respiratory: Negative for shortness of breath. Gastrointestinal: Negative for abdominal pain, vomiting and diarrhea. Genitourinary: Negative for dysuria. Musculoskeletal: Negative for back pain. Skin: Negative for rash. Neurological: Negative for headaches, focal weakness or numbness. ____________________________________________  PHYSICAL EXAM:  VITAL SIGNS: ED Triage Vitals  Enc Vitals Group     BP 03/27/15 1153 165/93 mmHg     Pulse Rate 03/27/15 1153 71     Resp 03/27/15 1153 18     Temp 03/27/15 1153 98.3 F (36.8 C)     Temp Source 03/27/15 1153 Oral     SpO2 03/27/15 1153 100 %     Weight 03/27/15 1153 180 lb (81.647 kg)     Height 03/27/15 1153  (1.727 m)     Head Cir --      Peak Flow --      Pain Score 03/27/15 1153 6     Pain Loc --      Pain Edu? --      Excl. in GC? --    Constitutional: Alert and oriented. Well appearing and in no distress. Eyes: Conjunctivae are normal. PERRL. Normal extraocular movements. ENT   Head: Normocephalic and atraumatic.   Nose: No congestion/rhinnorhea.      Ears: TMs clear bilaterally without erythema, bulging, or perforation. Canals without cerumen or edema.  Mouth/Throat: Mucous membranes are moist.   Neck: Supple. No thyromegaly. Hematological/Lymphatic/Immunilogical: No cervical lymphadenopathy. Cardiovascular: Normal rate, regular rhythm.  Respiratory: Normal respiratory effort. No wheezes/rales/rhonchi. Musculoskeletal: Nontender with normal range of motion in all extremities.  Neurologic:  Normal gait without ataxia. Normal speech and language. No gross focal neurologic deficits are appreciated. Skin:  Skin is warm, dry and intact. No rash noted. Psychiatric: Mood and affect are normal. Patient exhibits appropriate insight and judgment. ____________________________________________  INITIAL IMPRESSION / ASSESSMENT AND PLAN /  ED COURSE  Reassurance to the patient about normal exam. Suggest daily allergy medicine and Tylenol for otalgia and neck pains. Follow-up with new primary provider as scheduled. ____________________________________________  FINAL CLINICAL IMPRESSION(S) / ED DIAGNOSES  Final diagnoses:  Otalgia of both ears  Elevated blood pressure      Lissa Hoard, PA-C 03/27/15 2011  Jene Every, MD 03/28/15 2033

## 2015-03-27 NOTE — ED Notes (Signed)
Pt c/o left ear pain x 1 week with pain radiating into neck. Reports that he has been congested. Was just seen here 2 days and placed on an inhaler.

## 2015-04-07 DIAGNOSIS — I10 Essential (primary) hypertension: Secondary | ICD-10-CM | POA: Insufficient documentation

## 2015-04-07 DIAGNOSIS — F1110 Opioid abuse, uncomplicated: Secondary | ICD-10-CM | POA: Insufficient documentation

## 2015-04-07 DIAGNOSIS — G43709 Chronic migraine without aura, not intractable, without status migrainosus: Secondary | ICD-10-CM | POA: Insufficient documentation

## 2015-04-26 ENCOUNTER — Emergency Department
Admission: EM | Admit: 2015-04-26 | Discharge: 2015-04-26 | Disposition: A | Discharge status: Home / Self Care | Payer: BLUE CROSS/BLUE SHIELD | Attending: Emergency Medicine | Admitting: Emergency Medicine

## 2015-04-26 ENCOUNTER — Encounter: Payer: Self-pay | Admitting: Emergency Medicine

## 2015-04-26 DIAGNOSIS — I10 Essential (primary) hypertension: Secondary | ICD-10-CM | POA: Insufficient documentation

## 2015-04-26 DIAGNOSIS — J302 Other seasonal allergic rhinitis: Secondary | ICD-10-CM | POA: Diagnosis not present

## 2015-04-26 DIAGNOSIS — J029 Acute pharyngitis, unspecified: Secondary | ICD-10-CM

## 2015-04-26 DIAGNOSIS — Z720 Tobacco use: Secondary | ICD-10-CM | POA: Diagnosis not present

## 2015-04-26 DIAGNOSIS — Z79899 Other long term (current) drug therapy: Secondary | ICD-10-CM | POA: Insufficient documentation

## 2015-04-26 DIAGNOSIS — R600 Localized edema: Secondary | ICD-10-CM | POA: Diagnosis present

## 2015-04-26 DIAGNOSIS — J0100 Acute maxillary sinusitis, unspecified: Secondary | ICD-10-CM | POA: Insufficient documentation

## 2015-04-26 HISTORY — DX: Essential (primary) hypertension: I10

## 2015-04-26 MED ORDER — BENZONATATE 100 MG PO CAPS: 100.0000 mg | ORAL_CAPSULE | Freq: Three times a day (TID) | ORAL | Status: DC | PRN

## 2015-04-26 MED ORDER — MOMETASONE FUROATE 50 MCG/ACT NA SUSP: 2.0000 | Freq: Every day | NASAL | Status: DC

## 2015-04-26 NOTE — ED Notes (Signed)
AAOx3.  Skin warm and dry.  NAD.  D/C home. Ambulates with easy and steady gait. 

## 2015-04-26 NOTE — ED Notes (Signed)
Pt reports throat feels like it has been swelling for the past 2 days.  Pt reports he feels like "something is stuck in the back of his throat".  Pt also reports recently taking new antibiotics for tooth pain.  Pt currently in no distress.

## 2015-04-26 NOTE — ED Provider Notes (Signed)
[facility] Emergency Department Provider Note ____________________________________________  Time seen: 1625  I have reviewed the triage vital signs and the nursing notes.  HISTORY  Chief Complaint  Oral Swelling  HPI Douglas Barton is a 44 y.o. male reports to the ED with some concerns about some intermittent fullness to the throat for the past 2 days. He reports that at times he feels as if there is something stuck in his throat that he cannot cough up. He is currently taking a prescription of amoxicillin for a recent dental infection, and concurrently, has started on a new prescription of propranolol for hypertension. He voices some concern for possible allergic reaction to either or both of those medications.he denies any rash, fever, swelling of the mouth, lips, throat, or difficulty breathing. He also denies a previous her known history of allergies to any medicines including penicillins.  Past Medical History  Diagnosis Date  . Hypertension    There are no active problems to display for this patient.  Past Surgical History  Procedure Laterality Date  . Appendectomy      Current Outpatient Rx  Name  Route  Sig  Dispense  Refill  . albuterol (PROVENTIL HFA;VENTOLIN HFA) 108 (90 BASE) MCG/ACT inhaler      Inhale 4-6 puffs by mouth every 4 hours as needed for wheezing, cough, and/or shortness of breath   1 Inhaler   1   . benzonatate (TESSALON PERLES) 100 MG capsule   Oral   Take 1 capsule (100 mg total) by mouth 3 (three) times daily as needed for cough (Take 1-2 per dose).   30 capsule   0   . ibuprofen (ADVIL,MOTRIN) 800 MG tablet   Oral   Take 1 tablet (800 mg total) by mouth every 8 (eight) hours as needed for moderate pain.   15 tablet   0   . mometasone (NASONEX) 50 MCG/ACT nasal spray   Nasal   Place 2 sprays into the nose daily.   17 g   0   . traMADol (ULTRAM) 50 MG tablet   Oral   Take 1 tablet (50 mg total) by mouth every 6  (six) hours as needed for moderate pain.   12 tablet   0    Allergies Review of patient's allergies indicates no known allergies.  History reviewed. No pertinent family history.  Social History Social History  Substance Use Topics  . Smoking status: Current Every Day Smoker -- 2.00 packs/day    Types: Cigarettes  . Smokeless tobacco: Never Used  . Alcohol Use: No   Review of Systems  Constitutional: Negative for fever. Eyes: Negative for visual changes. ENT: Negative for sore throat. Throat foreign body sensation as above. Cardiovascular: Negative for chest pain. Respiratory: Negative for shortness of breath. Gastrointestinal: Negative for abdominal pain, vomiting and diarrhea. Genitourinary: Negative for dysuria. Musculoskeletal: Negative for back pain. Skin: Negative for rash. Neurological: Negative for headaches, focal weakness or numbness. ____________________________________________  PHYSICAL EXAM:  VITAL SIGNS: ED Triage Vitals  Enc Vitals Group     BP 04/26/15 1427 163/117 mmHg     Pulse Rate 04/26/15 1427 70     Resp 04/26/15 1427 20     Temp 04/26/15 1427 98.4 F (36.9 C)     Temp Source 04/26/15 1427 Oral     SpO2 04/26/15 1427 99 %     Weight 04/26/15 1427 175 lb (79.379 kg)     Height 04/26/15 1427  (1.727 m)  Head Cir --      Peak Flow --      Pain Score 04/26/15 1429 4     Pain Loc --      Pain Edu? --      Excl. in GC? --    Constitutional: Alert and oriented. Well appearing and in no distress. Eyes: Conjunctivae are normal. PERRL. Normal extraocular movements. Ears: Canals clear and TMs intact without bulge, rupture, or infection.   Head: Normocephalic and atraumatic.   Nose: No congestion/rhinnorhea.   Mouth/Throat: Mucous membranes are moist. Uvula is midline and tonsils are without enlargement or exudate. Oropharynx is injected and posterior wall has cobblestone appearance.   Neck: Supple. No  thyromegaly. Hematological/Lymphatic/Immunilogical: No cervical lymphadenopathy. Thyroid not palpable Cardiovascular: Normal rate, regular rhythm.  Respiratory: Normal respiratory effort. No wheezes/rales/rhonchi. Gastrointestinal: Soft and nontender. No distention. Musculoskeletal: Nontender with normal range of motion in all extremities.  Neurologic:  Normal gait without ataxia. Normal speech and language. No gross focal neurologic deficits are appreciated. Skin:  Skin is warm, dry and intact. No rash noted. Psychiatric: Mood and affect are normal. Patient exhibits appropriate insight and judgment. ____________________________________________  INITIAL IMPRESSION / ASSESSMENT AND PLAN / ED COURSE  Rhinitis/sinusitis and posterior nasal drainage. No indication of acute allergic reaction to medicines. Consider allergic pharyngitis or globus sensation. Reassurance to patient about normal exam. Suggest treatment with allergy medicines and nasal sprays. Follow-up with Dr. Gaynell Face as needed.  ____________________________________________  FINAL CLINICAL IMPRESSION(S) / ED DIAGNOSES  Final diagnoses:  Other seasonal allergic rhinitis  Acute maxillary sinusitis, recurrence not specified  Pharyngitis     Lissa Hoard, PA-C 04/26/15 1803  Arnaldo Natal, MD 04/26/15 [phone]

## 2015-04-26 NOTE — Discharge Instructions (Signed)
Allergic Rhinitis Allergic rhinitis is when the mucous membranes in the nose respond to allergens. Allergens are particles in the air that cause your body to have an allergic reaction. This causes you to release allergic antibodies. Through a chain of events, these eventually cause you to release histamine into the blood stream. Although meant to protect the body, it is this release of histamine that causes your discomfort, such as frequent sneezing, congestion, and an itchy, runny nose.  CAUSES  Seasonal allergic rhinitis (hay fever) is caused by pollen allergens that may come from grasses, trees, and weeds. Year-round allergic rhinitis (perennial allergic rhinitis) is caused by allergens such as house dust mites, pet dander, and mold spores.  SYMPTOMS   Nasal stuffiness (congestion).  Itchy, runny nose with sneezing and tearing of the eyes. DIAGNOSIS  Your health care provider can help you determine the allergen or allergens that trigger your symptoms. If you and your health care provider are unable to determine the allergen, skin or blood testing may be used. TREATMENT  Allergic rhinitis does not have a cure, but it can be controlled by:  Medicines and allergy shots (immunotherapy).  Avoiding the allergen. Hay fever may often be treated with antihistamines in pill or nasal spray forms. Antihistamines block the effects of histamine. There are over-the-counter medicines that may help with nasal congestion and swelling around the eyes. Check with your health care provider before taking or giving this medicine.  If avoiding the allergen or the medicine prescribed do not work, there are many new medicines your health care provider can prescribe. Stronger medicine may be used if initial measures are ineffective. Desensitizing injections can be used if medicine and avoidance does not work. Desensitization is when a patient is given ongoing shots until the body becomes less sensitive to the allergen.  Make sure you follow up with your health care provider if problems continue. HOME CARE INSTRUCTIONS It is not possible to completely avoid allergens, but you can reduce your symptoms by taking steps to limit your exposure to them. It helps to know exactly what you are allergic to so that you can avoid your specific triggers. SEEK MEDICAL CARE IF:   You have a fever.  You develop a cough that does not stop easily (persistent).  You have shortness of breath.  You start wheezing.  Symptoms interfere with normal daily activities. Document Released: 05/17/2001 Document Revised: 08/27/2013 Document Reviewed: 04/29/2013 [facility] Patient Information 2015 [place], [place]. This information is not intended to replace advice given to you by your health care provider. Make sure you discuss any questions you have with your health care provider.  Sinusitis Sinusitis is redness, soreness, and puffiness (inflammation) of the air pockets in the bones of your face (sinuses). The redness, soreness, and puffiness can cause air and mucus to get trapped in your sinuses. This can allow germs to grow and cause an infection.  HOME CARE   Drink enough fluids to keep your pee (urine) clear or pale yellow.  Use a humidifier in your home.  Run a hot shower to create steam in the bathroom. Sit in the bathroom with the door closed. Breathe in the steam 3-4 times a day.  Put a warm, moist washcloth on your face 3-4 times a day, or as told by your doctor.  Use salt water sprays (saline sprays) to wet the thick fluid in your nose. This can help the sinuses drain.  Only take medicine as told by your doctor. GET HELP RIGHT  AWAY IF:   Your pain gets worse.  You have very bad headaches.  You are sick to your stomach (nauseous).  You throw up (vomit).  You are very sleepy (drowsy) all the time.  Your face is puffy (swollen).  Your vision changes.  You have a stiff neck.  You have trouble breathing. MAKE  SURE YOU:   Understand these instructions.  Will watch your condition.  Will get help right away if you are not doing well or get worse. Document Released: 02/08/2008 Document Revised: 05/16/2012 Document Reviewed: 03/27/2012 [facility] Patient Information 2015 [place], [place]. This information is not intended to replace advice given to you by your health care provider. Make sure you discuss any questions you have with your health care provider.  Salt Water Gargle This solution will help make your mouth and throat feel better. HOME CARE INSTRUCTIONS   Mix 1 teaspoon of salt in 8 ounces of warm water.  Gargle with this solution as much or often as you need or as directed. Swish and gargle gently if you have any sores or wounds in your mouth.  Do not swallow this mixture. Document Released: 05/26/2004 Document Revised: 11/14/2011 Document Reviewed: 10/17/2008 [facility] Patient Information 2015 [place], [place]. This information is not intended to replace advice given to you by your health care provider. Make sure you discuss any questions you have with your health care provider.  Your exam does not indicate an allergic reaction to your medicines. Use the prescriptions as directed. Follow-up with Dr. Gaynell Face as needed.

## 2015-05-08 ENCOUNTER — Ambulatory Visit: Payer: BLUE CROSS/BLUE SHIELD | Attending: Otolaryngology

## 2015-05-08 DIAGNOSIS — I10 Essential (primary) hypertension: Secondary | ICD-10-CM | POA: Diagnosis not present

## 2015-05-08 DIAGNOSIS — G4733 Obstructive sleep apnea (adult) (pediatric): Secondary | ICD-10-CM | POA: Insufficient documentation

## 2015-05-08 DIAGNOSIS — R5382 Chronic fatigue, unspecified: Secondary | ICD-10-CM | POA: Insufficient documentation

## 2015-05-08 DIAGNOSIS — R0683 Snoring: Secondary | ICD-10-CM | POA: Diagnosis present

## 2015-05-08 DIAGNOSIS — F17210 Nicotine dependence, cigarettes, uncomplicated: Secondary | ICD-10-CM | POA: Diagnosis not present

## 2015-05-08 DIAGNOSIS — G4730 Sleep apnea, unspecified: Secondary | ICD-10-CM | POA: Diagnosis present

## 2015-05-08 DIAGNOSIS — G4710 Hypersomnia, unspecified: Secondary | ICD-10-CM | POA: Diagnosis present

## 2015-07-31 ENCOUNTER — Emergency Department
Admission: EM | Admit: 2015-07-31 | Discharge: 2015-07-31 | Disposition: A | Discharge status: Home / Self Care | Payer: BLUE CROSS/BLUE SHIELD | Attending: Emergency Medicine | Admitting: Emergency Medicine

## 2015-07-31 DIAGNOSIS — I10 Essential (primary) hypertension: Secondary | ICD-10-CM | POA: Insufficient documentation

## 2015-07-31 DIAGNOSIS — R21 Rash and other nonspecific skin eruption: Secondary | ICD-10-CM

## 2015-07-31 DIAGNOSIS — F17210 Nicotine dependence, cigarettes, uncomplicated: Secondary | ICD-10-CM | POA: Insufficient documentation

## 2015-07-31 DIAGNOSIS — Z79899 Other long term (current) drug therapy: Secondary | ICD-10-CM | POA: Insufficient documentation

## 2015-07-31 MED ORDER — PREDNISONE 50 MG PO TABS: 50.0000 mg | ORAL_TABLET | Freq: Every day | ORAL | Status: DC

## 2015-07-31 NOTE — ED Notes (Signed)
MD at bedside. 

## 2015-07-31 NOTE — ED Notes (Signed)
Pt c/o none raised, no itchy rash on Bl legs for the past 2 weeks and was seen by PCP..Marland Kitchen

## 2015-07-31 NOTE — ED Provider Notes (Signed)
[facility] Emergency Department Provider Note  ____________________________________________  Time seen: On arrival  I have reviewed the triage vital signs and the nursing notes.   HISTORY  Chief Complaint Rash    HPI CARLIE CORPUS is a 44 y.o. male who presents with rash to the lower extremity is for approximately 2 weeks. Initially it was an itchy rash but he reports and no longer really bothersome. He saw his PCP who put him on steroid cream and something oral for the itching. He denies fevers chills. The rash does not spread. No recent travel. He is not working in the [employer]. The rash is located on his lower extremities only    Past Medical History  Diagnosis Date  . Hypertension     There are no active problems to display for this patient.   Past Surgical History  Procedure Laterality Date  . Appendectomy      Current Outpatient Rx  Name  Route  Sig  Dispense  Refill  . albuterol (PROVENTIL HFA;VENTOLIN HFA) 108 (90 BASE) MCG/ACT inhaler      Inhale 4-6 puffs by mouth every 4 hours as needed for wheezing, cough, and/or shortness of breath   1 Inhaler   1   . benzonatate (TESSALON PERLES) 100 MG capsule   Oral   Take 1 capsule (100 mg total) by mouth 3 (three) times daily as needed for cough (Take 1-2 per dose).   30 capsule   0   . ibuprofen (ADVIL,MOTRIN) 800 MG tablet   Oral   Take 1 tablet (800 mg total) by mouth every 8 (eight) hours as needed for moderate pain.   15 tablet   0   . mometasone (NASONEX) 50 MCG/ACT nasal spray   Nasal   Place 2 sprays into the nose daily.   17 g   0   . predniSONE (DELTASONE) 50 MG tablet   Oral   Take 1 tablet (50 mg total) by mouth daily with breakfast.   5 tablet   0   . traMADol (ULTRAM) 50 MG tablet   Oral   Take 1 tablet (50 mg total) by mouth every 6 (six) hours as needed for moderate pain.   12 tablet   0     Allergies Review of patient's allergies indicates no known  allergies.  No family history on file.  Social History Social History  Substance Use Topics  . Smoking status: Current Every Day Smoker -- 2.00 packs/day    Types: Cigarettes  . Smokeless tobacco: Never Used  . Alcohol Use: No    Review of Systems  Constitutional: Negative for fever. Eyes: Negative for visual changes. ENT: Negative for sore throat Genitourinary: Negative for dysuria. No discharge Musculoskeletal: Negative for back pain. Skin: Positive for rash Neurological: Negative for headaches   ____________________________________________   PHYSICAL EXAM:  VITAL SIGNS: ED Triage Vitals  Enc Vitals Group     BP 07/31/15 1745 165/104 mmHg     Pulse --      Resp 07/31/15 1745 16     Temp 07/31/15 1745 98.4 F (36.9 C)     Temp Source 07/31/15 1745 Oral     SpO2 07/31/15 1745 97 %     Weight 07/31/15 1745 183 lb (83.008 kg)     Height 07/31/15 1745  (1.727 m)     Head Cir --      Peak Flow --      Pain Score 07/31/15 1836 0  Pain Loc --      Pain Edu? --      Excl. in GC? --      Constitutional: Alert and oriented. Well appearing and in no distress. Eyes: Conjunctivae are normal.  ENT   Head: Normocephalic and atraumatic.   Mouth/Throat: Mucous membranes are moist. Cardiovascular: Normal rate, regular rhythm.  Respiratory: Normal respiratory effort without tachypnea nor retractions.  Gastrointestinal: Soft and non-tender in all quadrants. No distention. There is no CVA tenderness. Musculoskeletal: Nontender with normal range of motion in all extremities. Neurologic:  Normal speech and language. No gross focal neurologic deficits are appreciated. Skin:  Skin is warm, dry. Erythematous rash to lower extremity is, nonblanching, no pruritus, no edema Psychiatric: Mood and affect are normal. Patient exhibits appropriate insight and judgment.  ____________________________________________    LABS (pertinent positives/negatives)  Labs Reviewed  - No data to display  ____________________________________________     ____________________________________________    RADIOLOGY I have personally reviewed any xrays that were ordered on this patient: None  ____________________________________________   PROCEDURES  Procedure(s) performed: none   ____________________________________________   INITIAL IMPRESSION / ASSESSMENT AND PLAN / ED COURSE  Pertinent labs & imaging results that were available during my care of the patient were reviewed by me and considered in my medical decision making (see chart for details).  Unclear cause of rash. We will attempt oral steroids. Patient is comfortable with normal vital signs  ____________________________________________   FINAL CLINICAL IMPRESSION(S) / ED DIAGNOSES  Final diagnoses:  Rash and nonspecific skin eruption     Jene Everyobert Samayra Hebel, MD 07/31/15 1924

## 2015-09-18 ENCOUNTER — Encounter: Payer: Self-pay | Admitting: Emergency Medicine

## 2015-09-18 ENCOUNTER — Emergency Department
Admission: EM | Admit: 2015-09-18 | Discharge: 2015-09-18 | Disposition: A | Discharge status: Home / Self Care | Payer: BLUE CROSS/BLUE SHIELD | Attending: Emergency Medicine | Admitting: Emergency Medicine

## 2015-09-18 ENCOUNTER — Emergency Department: Payer: BLUE CROSS/BLUE SHIELD

## 2015-09-18 DIAGNOSIS — Z7951 Long term (current) use of inhaled steroids: Secondary | ICD-10-CM | POA: Diagnosis not present

## 2015-09-18 DIAGNOSIS — I10 Essential (primary) hypertension: Secondary | ICD-10-CM | POA: Insufficient documentation

## 2015-09-18 DIAGNOSIS — E119 Type 2 diabetes mellitus without complications: Secondary | ICD-10-CM | POA: Diagnosis not present

## 2015-09-18 DIAGNOSIS — F17210 Nicotine dependence, cigarettes, uncomplicated: Secondary | ICD-10-CM | POA: Insufficient documentation

## 2015-09-18 DIAGNOSIS — N23 Unspecified renal colic: Secondary | ICD-10-CM | POA: Insufficient documentation

## 2015-09-18 DIAGNOSIS — Z7952 Long term (current) use of systemic steroids: Secondary | ICD-10-CM | POA: Diagnosis not present

## 2015-09-18 DIAGNOSIS — M549 Dorsalgia, unspecified: Secondary | ICD-10-CM

## 2015-09-18 DIAGNOSIS — M545 Low back pain: Secondary | ICD-10-CM | POA: Diagnosis present

## 2015-09-18 LAB — URINALYSIS COMPLETE WITH MICROSCOPIC (ARMC ONLY)
BACTERIA UA: NONE SEEN
BILIRUBIN URINE: NEGATIVE
GLUCOSE, UA: NEGATIVE mg/dL
Ketones, ur: NEGATIVE mg/dL
Leukocytes, UA: NEGATIVE
NITRITE: NEGATIVE
Protein, ur: NEGATIVE mg/dL
SPECIFIC GRAVITY, URINE: 1.002 — AB (ref 1.005–1.030)
Squamous Epithelial / LPF: NONE SEEN
pH: 7 (ref 5.0–8.0)

## 2015-09-18 LAB — BASIC METABOLIC PANEL
Anion gap: 7 (ref 5–15)
BUN: 13 mg/dL (ref 6–20)
CALCIUM: 9.9 mg/dL (ref 8.9–10.3)
CHLORIDE: 107 mmol/L (ref 101–111)
CO2: 30 mmol/L (ref 22–32)
CREATININE: 1.09 mg/dL (ref 0.61–1.24)
GFR calc non Af Amer: >60 mL/min (ref >60)
Glucose, Bld: 91 mg/dL (ref 65–99)
Potassium: 3.8 mmol/L (ref 3.5–5.1)
SODIUM: 144 mmol/L (ref 135–145)

## 2015-09-18 LAB — CBC
HCT: 48.3 % (ref 40.0–52.0)
Hemoglobin: 16.3 g/dL (ref 13.0–18.0)
MCH: 31.7 pg (ref 26.0–34.0)
MCHC: 33.8 g/dL (ref 32.0–36.0)
MCV: 93.8 fL (ref 80.0–100.0)
PLATELETS: 208 10*3/uL (ref 150–440)
RBC: 5.15 MIL/uL (ref 4.40–5.90)
RDW: 13.8 % (ref 11.5–14.5)
WBC: 14.1 10*3/uL — ABNORMAL HIGH (ref 3.8–10.6)

## 2015-09-18 MED ORDER — KETOROLAC TROMETHAMINE 60 MG/2ML IM SOLN
60.0000 mg | Freq: Once | INTRAMUSCULAR | Status: AC
  Administered 2015-09-18: 60 mg via INTRAMUSCULAR
  Filled 2015-09-18: qty 2

## 2015-09-18 MED ORDER — KETOROLAC TROMETHAMINE 60 MG/2ML IM SOLN: 60.0000 mg | Freq: Once | INTRAMUSCULAR | Status: DC

## 2015-09-18 MED ORDER — ONDANSETRON HCL 4 MG PO TABS: 4.0000 mg | ORAL_TABLET | Freq: Three times a day (TID) | ORAL | Status: DC | PRN

## 2015-09-18 MED ORDER — OXYCODONE-ACETAMINOPHEN 5-325 MG PO TABS: 1.0000 | ORAL_TABLET | Freq: Four times a day (QID) | ORAL | Status: DC | PRN

## 2015-09-18 MED ORDER — ONDANSETRON 4 MG PO TBDP
4.0000 mg | ORAL_TABLET | Freq: Once | ORAL | Status: AC
  Administered 2015-09-18: 4 mg via ORAL
  Filled 2015-09-18: qty 1

## 2015-09-18 NOTE — ED Notes (Signed)
Severe lower back pain x1 hour,  Prior to this event been having on and off pain x 2 weeks , no injury

## 2015-09-18 NOTE — ED Provider Notes (Signed)
CSN: [id number]     Arrival date & time 09/18/15  1813 History   First MD Initiated Contact with Patient 09/18/15 1943     Chief Complaint  Patient presents with  . Back Pain     (Consider location/radiation/quality/duration/timing/severity/associated sxs/prior Treatment) HPI  45 year old male presents to emergency department for evaluation of acute lower back pain. Patient has right greater than left lower back pain is been present for about 2 hours. Pain was sharp and sudden onset. No trauma or injury. Pain is located in lower back and will occasionally radiate to the lower abdomen and pelvis. He has a history kidney stones, last stone was 7-8 years ago treated nonoperatively. Patient took half of a Percocet at home, no relief. His pain is increased with movement. He gets some relief with lying still. He has had increased urinary frequency. No noticeable blood. No fevers chest pain shortness of breath. Patient has had appendix removed.  Past Medical History  Diagnosis Date  . Hypertension    Past Surgical History  Procedure Laterality Date  . Appendectomy     No family history on file. Social History  Substance Use Topics  . Smoking status: Current Every Day Smoker -- 2.00 packs/day    Types: Cigarettes  . Smokeless tobacco: Never Used  . Alcohol Use: No    Review of Systems  Constitutional: Negative.  Negative for fever, chills, activity change and appetite change.  HENT: Negative for congestion, ear pain, mouth sores, rhinorrhea, sinus pressure, sore throat and trouble swallowing.   Eyes: Negative for photophobia, pain and discharge.  Respiratory: Negative for cough, chest tightness and shortness of breath.   Cardiovascular: Negative for chest pain and leg swelling.  Gastrointestinal: Negative for nausea, vomiting, abdominal pain, diarrhea and abdominal distention.  Genitourinary: Positive for frequency and flank pain. Negative for dysuria and difficulty urinating.   Musculoskeletal: Positive for back pain. Negative for arthralgias and gait problem.  Skin: Negative for color change and rash.  Neurological: Negative for dizziness, numbness and headaches.  Hematological: Negative for adenopathy.  Psychiatric/Behavioral: Negative for behavioral problems and agitation.      Allergies  Review of patient's allergies indicates no known allergies.  Home Medications   Prior to Admission medications   Medication Sig Start Date End Date Taking? Authorizing Provider  albuterol (PROVENTIL HFA;VENTOLIN HFA) 108 (90 BASE) MCG/ACT inhaler Inhale 4-6 puffs by mouth every 4 hours as needed for wheezing, cough, and/or shortness of breath 03/25/15   Loleta Rose, MD  benzonatate (TESSALON PERLES) 100 MG capsule Take 1 capsule (100 mg total) by mouth 3 (three) times daily as needed for cough (Take 1-2 per dose). 04/26/15   Jenise V Bacon Menshew, PA-C  ibuprofen (ADVIL,MOTRIN) 800 MG tablet Take 1 tablet (800 mg total) by mouth every 8 (eight) hours as needed for moderate pain. 02/26/15   Joni Reining, PA-C  mometasone (NASONEX) 50 MCG/ACT nasal spray Place 2 sprays into the nose daily. 04/26/15   Jenise V Bacon Menshew, PA-C  ondansetron (ZOFRAN) 4 MG tablet Take 1 tablet (4 mg total) by mouth every 8 (eight) hours as needed for nausea or vomiting. 09/18/15   Evon Slack, PA-C  oxyCODONE-acetaminophen (ROXICET) 5-325 MG tablet Take 1-2 tablets by mouth every 6 (six) hours as needed for severe pain. 09/18/15   Evon Slack, PA-C  predniSONE (DELTASONE) 50 MG tablet Take 1 tablet (50 mg total) by mouth daily with breakfast. 07/31/15   Jene Every, MD  traMADol Janean Sark) 50  MG tablet Take 1 tablet (50 mg total) by mouth every 6 (six) hours as needed for moderate pain. 02/26/15   Joni Reiningonald K Smith, PA-C   BP 176/112 mmHg  Pulse 71  Temp(Src) 98 F (36.7 C) (Oral)  Resp 18  Ht 5\' 8"  (1.727 m)  Wt 83.915 kg  BMI 28.14 kg/m2  SpO2 99% Physical Exam  Constitutional: He  is oriented to person, place, and time. He appears well-developed and well-nourished.  HENT:  Head: Normocephalic and atraumatic.  Eyes: Conjunctivae and EOM are normal. Pupils are equal, round, and reactive to light.  Neck: Normal range of motion. Neck supple.  Cardiovascular: Normal rate, regular rhythm, normal heart sounds and intact distal pulses.   Pulmonary/Chest: Effort normal and breath sounds normal. No respiratory distress. He has no wheezes. He has no rales. He exhibits no tenderness.  Abdominal: Soft. Bowel sounds are normal. He exhibits no distension. There is no tenderness.  Musculoskeletal: Normal range of motion. He exhibits no edema or tenderness.  Positive lower back pain with CVA tenderness.  Neurological: He is alert and oriented to person, place, and time.  Skin: Skin is warm and dry.  Psychiatric: He has a normal mood and affect. His behavior is normal. Judgment and thought content normal.    ED Course  Procedures (including critical care time) Labs Review Labs Reviewed  CBC - Abnormal; Notable for the following:    WBC 14.1 (*)    All other components within normal limits  URINALYSIS COMPLETEWITH MICROSCOPIC ([facility] ONLY) - Abnormal; Notable for the following:    Color, Urine COLORLESS (*)    APPearance CLEAR (*)    Specific Gravity, Urine 1.002 (*)    Hgb urine dipstick 1+ (*)    All other components within normal limits  BASIC METABOLIC PANEL    Imaging Review Ct Renal Stone Study  09/18/2015  CLINICAL DATA:  Right flank pain with nausea for 2 weeks. Previous appendectomy. EXAM: CT ABDOMEN AND PELVIS WITHOUT CONTRAST TECHNIQUE: Multidetector CT imaging of the abdomen and pelvis was performed following the standard protocol without IV contrast. COMPARISON:  09/13/2010 FINDINGS: Lower chest:  No acute findings. Hepatobiliary: No mass visualized on this un-enhanced exam. Gallbladder is unremarkable. Pancreas: No mass or inflammatory process identified on this  un-enhanced exam. Spleen: Within normal limits in size. Adrenals/Urinary Tract: Tiny bilateral renal calculi versus vascular calcifications noted. No evidence of hydronephrosis. No evidence of ureteral calculi or dilatation. No calculi seen within the bladder. Stomach/Bowel: No evidence of obstruction, inflammatory process, or abnormal fluid collections. Vascular/Lymphatic: No pathologically enlarged lymph nodes. No evidence of abdominal aortic aneurysm. Reproductive: No mass or other significant abnormality. Other: None. Musculoskeletal:  No suspicious bone lesions identified. IMPRESSION: Tiny bilateral nonobstructive renal calculi versus vascular calcification. No evidence of ureteral calculi, hydronephrosis, or other acute findings. Electronically Signed   By: Myles RosenthalJohn  Stahl M.D.   On: 09/18/2015 20:20   I have personally reviewed and evaluated these images and lab results as part of my medical decision-making.   EKG Interpretation None      MDM   Final diagnoses:  Back pain  Renal colic on right side    45 year old male presents to emergency department for evaluation of right lower back pain with radiation into the right pelvic region. Pain was sudden onset with increased urinary frequency and nausea. Urinalysis showed no signs of infection, 1+ hemoglobin. CT abdomen and pelvis showed tiny bilateral nonobstructive renal calculi with no evidence of ureteral calculi. Patient's pain  and nausea were improved significantly with Toradol 60 mg IM and Zofran 4 mg by mouth. He is able tolerate by mouth fluids. He is urinating well and continues to have increased frequency. Vital signs are stable, afebrile. Patient is educated on red flags to return to the ER for such as fevers, increased pain, increased nausea/vomiting or for any urgent changes in his health. She will increase by mouth fluids. Follow-up with urologist.    Evon Slack, PA-C 09/18/15 2150  Darien Ramus, MD 09/18/15 [phone]

## 2015-09-18 NOTE — Discharge Instructions (Signed)
Back Pain, Adult °Back pain is very common in adults. The cause of back pain is rarely dangerous and the pain often gets better over time. The cause of your back pain may not be known. Some common causes of back pain include: °· Strain of the muscles or ligaments supporting the spine. °· Wear and tear (degeneration) of the spinal disks. °· Arthritis. °· Direct injury to the back. °For many people, back pain may return. Since back pain is rarely dangerous, most people can learn to manage this condition on their own. °HOME CARE INSTRUCTIONS °Watch your back pain for any changes. The following actions may help to lessen any discomfort you are feeling: °· Remain active. It is stressful on your back to sit or stand in one place for long periods of time. Do not sit, drive, or stand in one place for more than 30 minutes at a time. Take short walks on even surfaces as soon as you are able. Try to increase the length of time you walk each day. °· Exercise regularly as directed by your health care provider. Exercise helps your back heal faster. It also helps avoid future injury by keeping your muscles strong and flexible. °· Do not stay in bed. Resting more than 1-2 days can delay your recovery. °· Pay attention to your body when you bend and lift. The most comfortable positions are those that put less stress on your recovering back. Always use proper lifting techniques, including: °· Bending your knees. °· Keeping the load close to your body. °· Avoiding twisting. °· Find a comfortable position to sleep. Use a firm mattress and lie on your side with your knees slightly bent. If you lie on your back, put a pillow under your knees. °· Avoid feeling anxious or stressed. Stress increases muscle tension and can worsen back pain. It is important to recognize when you are anxious or stressed and learn ways to manage it, such as with exercise. °· Take medicines only as directed by your health care provider. Over-the-counter  medicines to reduce pain and inflammation are often the most helpful. Your health care provider may prescribe muscle relaxant drugs. These medicines help dull your pain so you can more quickly return to your normal activities and healthy exercise. °· Apply ice to the injured area: °· Put ice in a plastic bag. °· Place a towel between your skin and the bag. °· Leave the ice on for 20 minutes, 2-3 times a day for the first 2-3 days. After that, ice and heat may be alternated to reduce pain and spasms. °· Maintain a healthy weight. Excess weight puts extra stress on your back and makes it difficult to maintain good posture. °SEEK MEDICAL CARE IF: °· You have pain that is not relieved with rest or medicine. °· You have increasing pain going down into the legs or buttocks. °· You have pain that does not improve in one week. °· You have night pain. °· You lose weight. °· You have a fever or chills. °SEEK IMMEDIATE MEDICAL CARE IF:  °· You develop new bowel or bladder control problems. °· You have unusual weakness or numbness in your arms or legs. °· You develop nausea or vomiting. °· You develop abdominal pain. °· You feel faint. °  °This information is not intended to replace advice given to you by your health care provider. Make sure you discuss any questions you have with your health care provider. °  °Document Released: 08/22/2005 Document Revised: 09/12/2014 Document Reviewed: 12/24/2013 °Elsevier Interactive Patient Education ©2016 Elsevier   Inc.  Renal Colic Renal colic is pain that is caused by a kidney stone. HOME CARE  Take medicines only as told by your doctor.  Ask your doctor if it is okay to take over-the-counter medicine for pain.  Drink enough fluid to keep your pee (urine) clear or pale yellow. Drink 6-8 glasses of water each day.  Eat less than 2 grams of salt per day.  Eat less protein. Some foods that have protein are meats, fish, nuts, and dairy.  Try not to eat spinach, rhubarb, nuts,  or bran. GET HELP IF:  You have a fever or chills.  Your pee smells bad or looks cloudy.  You have pain or burning when you pee. GET HELP RIGHT AWAY IF:  The pain in your side (flank) or your groin suddenly gets worse.  You get confused.  You pass out.   This information is not intended to replace advice given to you by your health care provider. Make sure you discuss any questions you have with your health care provider.   Document Released: 02/08/2008 Document Revised: 09/12/2014 Document Reviewed: 07/02/2014 Elsevier Interactive Patient Education [employer]2016 [employer] Inc.

## 2015-09-18 NOTE — ED Notes (Signed)
AAOx3.  Skin warm and dry.  NAD 

## 2015-09-23 ENCOUNTER — Emergency Department
Admission: EM | Admit: 2015-09-23 | Discharge: 2015-09-23 | Disposition: A | Discharge status: Home / Self Care | Payer: BLUE CROSS/BLUE SHIELD | Attending: Emergency Medicine | Admitting: Emergency Medicine

## 2015-09-23 ENCOUNTER — Encounter: Payer: Self-pay | Admitting: Emergency Medicine

## 2015-09-23 DIAGNOSIS — M549 Dorsalgia, unspecified: Secondary | ICD-10-CM | POA: Insufficient documentation

## 2015-09-23 DIAGNOSIS — R110 Nausea: Secondary | ICD-10-CM | POA: Diagnosis not present

## 2015-09-23 DIAGNOSIS — R109 Unspecified abdominal pain: Secondary | ICD-10-CM

## 2015-09-23 DIAGNOSIS — R197 Diarrhea, unspecified: Secondary | ICD-10-CM | POA: Diagnosis not present

## 2015-09-23 DIAGNOSIS — R1031 Right lower quadrant pain: Secondary | ICD-10-CM | POA: Insufficient documentation

## 2015-09-23 DIAGNOSIS — F17210 Nicotine dependence, cigarettes, uncomplicated: Secondary | ICD-10-CM | POA: Diagnosis not present

## 2015-09-23 LAB — URINALYSIS COMPLETE WITH MICROSCOPIC (ARMC ONLY)
BILIRUBIN URINE: NEGATIVE
Bacteria, UA: NONE SEEN
GLUCOSE, UA: NEGATIVE mg/dL
Ketones, ur: NEGATIVE mg/dL
Leukocytes, UA: NEGATIVE
Nitrite: NEGATIVE
Protein, ur: NEGATIVE mg/dL
SQUAMOUS EPITHELIAL / LPF: NONE SEEN
Specific Gravity, Urine: 1.005 (ref 1.005–1.030)
pH: 6 (ref 5.0–8.0)

## 2015-09-23 MED ORDER — KETOROLAC TROMETHAMINE 30 MG/ML IJ SOLN
60.0000 mg | Freq: Once | INTRAMUSCULAR | Status: AC
  Administered 2015-09-23: 60 mg via INTRAMUSCULAR
  Filled 2015-09-23: qty 2

## 2015-09-23 MED ORDER — HYDROCODONE-ACETAMINOPHEN 5-325 MG PO TABS: 1.0000 | ORAL_TABLET | ORAL | Status: DC | PRN

## 2015-09-23 NOTE — ED Notes (Addendum)
Pt c/o pain that has moved from R flank to RL abd.  Pt also sts that he went to work today felt dizzy while standing.  Pt ambulatory to room.

## 2015-09-23 NOTE — ED Provider Notes (Signed)
Chief Complaint:  Flank Pain  HPI:  Patient presents with continued right flank pain since his visit in this department on 1/13/207. At that time, CT scan of the abdomen showed no occlusive calculi. Pain now radiated to the RLQ. Had an appendectomy as a child. States he has been taking prescribed pain medications at 1/2 tablet every 2-3 hours.  Has run out of his pain medication and presents today for refill.  Drinking water and Gatorade to stay hydrated. Notes pain is about the same now as it was at his initial visit. Improves with lying still. Worsens with movement. Some nausea but no vomiting. Denies changes in urinary habits. No dysuria, hematuria, urgency/frequency/hesitancy of micturition.  Has an appointment with his PCP, Dr Dareen Piano on Monday September 27, 2014. Was unable to get an appointment for follow up sooner. Has a history of kidney stones in the remote past and current pain is similar.  Past Medical History  Diagnosis Date  . Hypertension     Past Surgical History  Procedure Laterality Date  . Appendectomy     Social History   Social History  . Marital Status: Single    Spouse Name: N/A  . Number of Children: N/A  . Years of Education: N/A   Occupational History  . Not on file.   Social History Main Topics  . Smoking status: Current Every Day Smoker -- 2.00 packs/day    Types: Cigarettes  . Smokeless tobacco: Never Used  . Alcohol Use: No  . Drug Use: No  . Sexual Activity: Yes   Other Topics Concern  . Not on file   Social History Narrative   No Known Allergies  Review of Systems  Constitutional: Negative for fever, chills and malaise/fatigue.  Respiratory: Negative for cough and shortness of breath.   Cardiovascular: Negative for chest pain, palpitations and leg swelling.  Gastrointestinal: Positive for nausea, abdominal pain and diarrhea. Negative for heartburn, vomiting and blood in stool.  Genitourinary: Positive for flank pain. Negative for dysuria,  urgency, frequency and hematuria.  Musculoskeletal: Positive for back pain. Negative for myalgias.  Skin: Negative for rash.  Neurological: Negative for dizziness, weakness and headaches.    Physical Exam  Constitutional: He appears well-developed and well-nourished. No distress.  Eyes: Conjunctivae are normal.  Neck: Normal range of motion. Neck supple.  Cardiovascular: Normal rate, regular rhythm and normal heart sounds.  Exam reveals no gallop and no friction rub.   No murmur heard. Pulmonary/Chest: Effort normal and breath sounds normal. No respiratory distress. He has no wheezes. He has no rales.  Abdominal: Soft. Normal appearance and bowel sounds are normal. He exhibits no distension, no ascites, no pulsatile midline mass and no mass. There is no hepatosplenomegaly. There is tenderness in the right lower quadrant. There is CVA tenderness (right). There is no rigidity, no rebound and no guarding. No hernia.  Skin: Skin is dry. No rash noted.  Psychiatric: He has a normal mood and affect. His behavior is normal.    Urinalysis reviewed to note moderate blood but no significant abnormality. Microscopic blood was minimal.   MDM Number of Diagnoses or Management Options Flank pain: established, improving Nausea: established, improving Patient Progress Patient progress: stable      Hope Pigeon, PA-C 09/23/15 1249  Richardean Canal, MD 09/23/15 [id number]

## 2015-09-23 NOTE — ED Notes (Signed)
Reports right flank pain, seen here Friday and dx with kidney stone.  States he ran out of pain meds but his follow up apt is not til next Monday.  Skin w/d.

## 2015-09-23 NOTE — ED Notes (Signed)
Jami PA went over D/C paperwork and let pt leave.

## 2015-09-23 NOTE — Discharge Instructions (Signed)
Flank Pain Flank pain refers to pain that is located on the side of the body between the upper abdomen and the back. The pain may occur over a short period of time (acute) or may be long-term or reoccurring (chronic). It may be mild or severe. Flank pain can be caused by many things. CAUSES  Some of the more common causes of flank pain include:  Muscle strains.   Muscle spasms.   A disease of your spine (vertebral disk disease).   A lung infection (pneumonia).   Fluid around your lungs (pulmonary edema).   A kidney infection.   Kidney stones.   A very painful skin rash caused by the chickenpox virus (shingles).   Gallbladder disease.  HOME CARE INSTRUCTIONS  Home care will depend on the cause of your pain. In general,  Rest as directed by your caregiver.  Drink enough fluids to keep your urine clear or pale yellow.  Only take over-the-counter or prescription medicines as directed by your caregiver. Some medicines may help relieve the pain.  Tell your caregiver about any changes in your pain.  Follow up with your caregiver as directed. SEEK IMMEDIATE MEDICAL CARE IF:   Your pain is not controlled with medicine.   You have new or worsening symptoms.  Your pain increases.   You have abdominal pain.   You have shortness of breath.   You have persistent nausea or vomiting.   You have swelling in your abdomen.   You feel faint or pass out.   You have blood in your urine.  You have a fever or persistent symptoms for more than 2-3 days.  You have a fever and your symptoms suddenly get worse. MAKE SURE YOU:   Understand these instructions.  Will watch your condition.  Will get help right away if you are not doing well or get worse.   This information is not intended to replace advice given to you by your health care provider. Make sure you discuss any questions you have with your health care provider.   Document Released: 10/13/2005 Document  Revised: 05/16/2012 Document Reviewed: 04/05/2012 Elsevier Interactive Patient Education 2016 [employer]   Limit use of pain medications for only moderate to severe pain. May take Motrin  every 8 hours as needed for pain.

## 2016-05-04 ENCOUNTER — Emergency Department
Admission: EM | Admit: 2016-05-04 | Discharge: 2016-05-04 | Disposition: A | Discharge status: Home / Self Care | Payer: BLUE CROSS/BLUE SHIELD | Attending: Emergency Medicine | Admitting: Emergency Medicine

## 2016-05-04 ENCOUNTER — Encounter: Payer: Self-pay | Admitting: Emergency Medicine

## 2016-05-04 DIAGNOSIS — M62838 Other muscle spasm: Secondary | ICD-10-CM | POA: Insufficient documentation

## 2016-05-04 DIAGNOSIS — I10 Essential (primary) hypertension: Secondary | ICD-10-CM | POA: Insufficient documentation

## 2016-05-04 DIAGNOSIS — R51 Headache: Secondary | ICD-10-CM | POA: Insufficient documentation

## 2016-05-04 DIAGNOSIS — R079 Chest pain, unspecified: Secondary | ICD-10-CM | POA: Insufficient documentation

## 2016-05-04 DIAGNOSIS — M542 Cervicalgia: Secondary | ICD-10-CM | POA: Insufficient documentation

## 2016-05-04 DIAGNOSIS — F17210 Nicotine dependence, cigarettes, uncomplicated: Secondary | ICD-10-CM | POA: Insufficient documentation

## 2016-05-04 MED ORDER — CYCLOBENZAPRINE HCL 10 MG PO TABS: 10.0000 mg | ORAL_TABLET | Freq: Three times a day (TID) | ORAL | 0 refills | Status: DC | PRN

## 2016-05-04 MED ORDER — NAPROXEN 500 MG PO TABS: 500.0000 mg | ORAL_TABLET | Freq: Two times a day (BID) | ORAL | 0 refills | Status: DC

## 2016-05-04 NOTE — ED Triage Notes (Signed)
States he has had left shoullder pain for 2 weeks  Denies any injury

## 2016-05-04 NOTE — ED Provider Notes (Signed)
[facility] Emergency Department Provider Note  ____________________________________________  Time seen: Approximately 2:08 PM  I have reviewed the triage vital signs and the nursing notes.   HISTORY  Chief Complaint Shoulder Pain    HPI Douglas Barton is a 45 y.o. male, NAD, presents to the emergency department with 2 week history of left shoulder pain. Patient states he's had gradually worsening left upper chest, shoulder and left neck pain over the last 2 weeks. States that the neck and shoulder feel tight. Denies any injuries, traumas or falls. Believes that he may have "slept wrong". Has been taking BC powders 6-7 times daily for the pain without resolution of symptoms. [facility] to help for a short time. He states that his sister-in-law was seen in the emergency department yesterday for chest pain and has been transferred to another medical facility and is "fighting for her life". Patient has been extremely worried and anxious about his own health since his sister-in-law has become ill. He denies any chest pain, palpitations, shortness of breath, diaphoresis, numbness, weakness, tingling, fatigue. Has not had any abdominal pain, nausea, vomiting. Has had headaches but no changes in such recently. Does note that he has hypertension which is controlled with propranolol but unfortunately does not tolerate the medication well as a cause somnolence. Patient states he breaks the 30 mg pills in two quarters and can only tolerate a quarter tablet at any given time. States he was placed on this medication to control anxiety but also to help with his hypertension. States he has an appointment with his primary care provider Dr. Dareen PianoAnderson in approximately 2 weeks for follow-up.   Past Medical History:  Diagnosis Date  . Hypertension     There are no active problems to display for this patient.   Past Surgical History:  Procedure Laterality Date  .  APPENDECTOMY      Prior to Admission medications   Medication Sig Start Date End Date Taking? Authorizing Provider  albuterol (PROVENTIL HFA;VENTOLIN HFA) 108 (90 BASE) MCG/ACT inhaler Inhale 4-6 puffs by mouth every 4 hours as needed for wheezing, cough, and/or shortness of breath 03/25/15   Loleta Roseory Forbach, MD  cyclobenzaprine (FLEXERIL) 10 MG tablet Take 1 tablet (10 mg total) by mouth 3 (three) times daily as needed for muscle spasms. 05/04/16   Lejuan Botto L Arlene Genova, PA-C  naproxen (NAPROSYN) 500 MG tablet Take 1 tablet (500 mg total) by mouth 2 (two) times daily with a meal. 05/04/16   Kenyetta Wimbish L Shante Archambeault, PA-C    Allergies Review of patient's allergies indicates no known allergies.  No family history on file.  Social History Social History  Substance Use Topics  . Smoking status: Current Every Day Smoker    Packs/day: 2.00    Types: Cigarettes  . Smokeless tobacco: Never Used  . Alcohol use No     Review of Systems  Constitutional: No fever/chills, Fatigue, diaphoresis Eyes: No visual changes.  Cardiovascular: No chest pain, Palpitations. Respiratory: No cough. No shortness of breath. No wheezing.  Gastrointestinal: No abdominal pain.  No nausea, vomiting.   Musculoskeletal: Positive left upper chest, left shoulder and left neck pain. No back pain nor lower extremity pain.  Skin: Negative for rash, redness, swelling, skin sores, bruising. Neurological: Positive for headaches, but no focal weakness or numbness. No tingling, dizziness. 10-point ROS otherwise negative.  ____________________________________________   PHYSICAL EXAM:  VITAL SIGNS: ED Triage Vitals  Enc Vitals Group     BP  Pulse      Resp      Temp      Temp src      SpO2      Weight      Height      Head Circumference      Peak Flow      Pain Score      Pain Loc      Pain Edu?      Excl. in GC?      Constitutional: Alert and oriented. Well appearing and in no acute distress. Eyes: Conjunctivae are  normal. PERRL. EOMI without pain.  Head: Atraumatic. Neck: No stridor, carotid bruits. Supple with full range of motion. Significant left trapezial muscle spasm with tenderness medially. Hematological/Lymphatic/Immunilogical: No cervical lymphadenopathy. Cardiovascular: Normal rate, regular rhythm. Normal S1 and S2.  No murmurs, rubs, gallops. Good peripheral circulation. Respiratory: Normal respiratory effort without tachypnea or retractions. Lungs CTAB with breath sounds noted in all lung fields Musculoskeletal: Mild tenderness to palpation about the left upper pectoral region that causes pain to radiate into the left shoulder. Palpation of the trapezial muscle also causes pain to radiate into the left upper pectoral region. No lower extremity tenderness nor edema.  No joint effusions. Neurologic:  Normal speech and language. No gross focal neurologic deficits are appreciated.  Skin:  Skin is warm, dry and intact. No rash, redness, swelling, bruising noted. Psychiatric: Mood and affect are normal although the patient becomes worried and anxious when he speaks about family members illness. Speech and behavior are normal. Patient exhibits appropriate insight and judgement.   ____________________________________________   LABS  None ____________________________________________  EKG  EKG reveals normal sinus rhythm without acute changes or evidence of STEMI. Ventricular rate of 67 beats per minute. ____________________________________________  RADIOLOGY  None ____________________________________________    PROCEDURES  Procedure(s) performed: None   Procedures   Medications - No data to display   ____________________________________________   INITIAL IMPRESSION / ASSESSMENT AND PLAN / ED COURSE  Pertinent labs & imaging results that were available during my care of the patient were reviewed by me and considered in my medical decision making (see chart for  details).  Clinical Course  Comment By Time  Long discussion had with the patient at the time of discharge in regards to blood pressure management. Patient states he has been prescribed propranolol for control of anxiety as well as blood pressure but unfortunately he does not tolerate a whole tablet due to fatigue and somnolence side effects. States he took a quarter of a tablet earlier this morning. I offered to start the patient on a blood pressure medication in which she would have less likelihood of side effects but at this time he declines. Discussed that it is more likely that the headaches he has experienced is very well related to his uncontrolled hypertension. He states he has an appointment with his primary care provider within the next 2 weeks for follow-up and will discuss with Dr. Dareen Piano at that time. I reiterated to the patient that if he has any significant visual changes, worsening of headache, numbness, weakness, tingling, chest pain, palpitations, fatigue, shortness of breath or diaphoresis he is to call 911 immediately to be seen at the closest emergency department. Patient verbalizes understanding of such. Hope Pigeon, PA-C 08/30 1511    Patient's diagnosis is consistent with muscle spasm of the trapezius. Patient's EKG was without abnormality and is reassuring considering the patient's pain can be reproduced with palpation.  Patient  will be discharged home with prescriptions for Flexeril and Naprosyn to take as directed. Patient is to follow up with his primary care provider if symptoms persist past this treatment course. Patient is given ED precautions to return to the ED for any worsening or new symptoms.    ____________________________________________  FINAL CLINICAL IMPRESSION(S) / ED DIAGNOSES  Final diagnoses:  Trapezius muscle spasm      NEW MEDICATIONS STARTED DURING THIS VISIT:  Discharge Medication List as of 05/04/2016  2:56 PM    START taking these  medications   Details  cyclobenzaprine (FLEXERIL) 10 MG tablet Take 1 tablet (10 mg total) by mouth 3 (three) times daily as needed for muscle spasms., Starting Wed 05/04/2016, Print    naproxen (NAPROSYN) 500 MG tablet Take 1 tablet (500 mg total) by mouth 2 (two) times daily with a meal., Starting Wed 05/04/2016, Print             Hope Pigeon, PA-C 05/04/16 1559    Nita Sickle, MD 05/05/16 1200

## 2016-06-29 ENCOUNTER — Encounter: Payer: Self-pay | Admitting: Emergency Medicine

## 2016-06-29 ENCOUNTER — Emergency Department
Admission: EM | Admit: 2016-06-29 | Discharge: 2016-06-29 | Disposition: A | Discharge status: Home / Self Care | Payer: BLUE CROSS/BLUE SHIELD | Attending: Emergency Medicine | Admitting: Emergency Medicine

## 2016-06-29 ENCOUNTER — Emergency Department: Payer: BLUE CROSS/BLUE SHIELD

## 2016-06-29 DIAGNOSIS — I10 Essential (primary) hypertension: Secondary | ICD-10-CM | POA: Insufficient documentation

## 2016-06-29 DIAGNOSIS — Z791 Long term (current) use of non-steroidal anti-inflammatories (NSAID): Secondary | ICD-10-CM | POA: Insufficient documentation

## 2016-06-29 DIAGNOSIS — Z79899 Other long term (current) drug therapy: Secondary | ICD-10-CM | POA: Diagnosis not present

## 2016-06-29 DIAGNOSIS — N200 Calculus of kidney: Secondary | ICD-10-CM | POA: Insufficient documentation

## 2016-06-29 DIAGNOSIS — R1031 Right lower quadrant pain: Secondary | ICD-10-CM

## 2016-06-29 DIAGNOSIS — R319 Hematuria, unspecified: Secondary | ICD-10-CM

## 2016-06-29 DIAGNOSIS — F17210 Nicotine dependence, cigarettes, uncomplicated: Secondary | ICD-10-CM | POA: Insufficient documentation

## 2016-06-29 LAB — CBC
HEMATOCRIT: 46.9 % (ref 40.0–52.0)
Hemoglobin: 16.3 g/dL (ref 13.0–18.0)
MCH: 32.3 pg (ref 26.0–34.0)
MCHC: 34.8 g/dL (ref 32.0–36.0)
MCV: 92.9 fL (ref 80.0–100.0)
PLATELETS: 216 10*3/uL (ref 150–440)
RBC: 5.04 MIL/uL (ref 4.40–5.90)
RDW: 13.4 % (ref 11.5–14.5)
WBC: 10.1 10*3/uL (ref 3.8–10.6)

## 2016-06-29 LAB — URINALYSIS COMPLETE WITH MICROSCOPIC (ARMC ONLY)
BACTERIA UA: NONE SEEN
BILIRUBIN URINE: NEGATIVE
GLUCOSE, UA: NEGATIVE mg/dL
KETONES UR: NEGATIVE mg/dL
LEUKOCYTES UA: NEGATIVE
Nitrite: NEGATIVE
Protein, ur: NEGATIVE mg/dL
SQUAMOUS EPITHELIAL / LPF: NONE SEEN
Specific Gravity, Urine: 1.004 — ABNORMAL LOW (ref 1.005–1.030)
WBC, UA: NONE SEEN WBC/hpf (ref 0–5)
pH: 6 (ref 5.0–8.0)

## 2016-06-29 LAB — COMPREHENSIVE METABOLIC PANEL
ALBUMIN: 4.2 g/dL (ref 3.5–5.0)
ALT: 32 U/L (ref 17–63)
AST: 26 U/L (ref 15–41)
Alkaline Phosphatase: 65 U/L (ref 38–126)
Anion gap: 9 (ref 5–15)
BUN: 12 mg/dL (ref 6–20)
CHLORIDE: 107 mmol/L (ref 101–111)
CO2: 23 mmol/L (ref 22–32)
CREATININE: 0.86 mg/dL (ref 0.61–1.24)
Calcium: 9 mg/dL (ref 8.9–10.3)
GFR calc Af Amer: >60 mL/min (ref >60)
GLUCOSE: 89 mg/dL (ref 65–99)
POTASSIUM: 3.6 mmol/L (ref 3.5–5.1)
Sodium: 139 mmol/L (ref 135–145)
TOTAL PROTEIN: 6.8 g/dL (ref 6.5–8.1)

## 2016-06-29 LAB — LIPASE, BLOOD: Lipase: 30 U/L (ref 11–51)

## 2016-06-29 MED ORDER — ONDANSETRON HCL 4 MG PO TABS: 4.0000 mg | ORAL_TABLET | Freq: Every day | ORAL | 0 refills | Status: DC | PRN

## 2016-06-29 NOTE — ED Provider Notes (Signed)
[facility] Emergency Department Provider Note  ____________________________________________   First MD Initiated Contact with Patient 06/29/16 1850     (approximate)  I have reviewed the triage vital signs and the nursing notes.   HISTORY  Chief Complaint Abdominal Pain   HPI Douglas Barton is a 45 y.o. male with a history of hypertension and kidney stones was presenting to the emergency department with 5 days of right lower quadrant abdominal pain. He says the pain is a 2 out of 10 at this time but says that it worsens when he sits up straight. He says that he may have "pulled something" at work because he works in [employer] and is frequently lifting heavy things. He says it feels like a dull pain and is not similar to his past kidney stones which felt sharp. He has not had any pain with urination orany blood in his urine. He says it feels like someone "kicked him between the legs." However, he denies any tenderness to the testicles or the penis. Denies any back pain. He has a history of an appendectomy.  Patient denies any nausea or vomiting.   Past Medical History:  Diagnosis Date  . Hypertension     There are no active problems to display for this patient.   Past Surgical History:  Procedure Laterality Date  . APPENDECTOMY      Prior to Admission medications   Medication Sig Start Date End Date Taking? Authorizing Provider  albuterol (PROVENTIL HFA;VENTOLIN HFA) 108 (90 BASE) MCG/ACT inhaler Inhale 4-6 puffs by mouth every 4 hours as needed for wheezing, cough, and/or shortness of breath 03/25/15  Yes Loleta Rose, MD  naproxen (NAPROSYN) 500 MG tablet Take 1 tablet (500 mg total) by mouth 2 (two) times daily with a meal. 05/04/16  Yes Jami L Hagler, PA-C  propranolol (INDERAL) 20 MG tablet Take 1 tablet by mouth 2 (two) times daily. 06/15/16  Yes Historical Provider, MD  ondansetron (ZOFRAN) 4 MG tablet Take 1 tablet (4 mg total) by mouth daily as  needed. 06/29/16   Myrna Blazer, MD    Allergies Review of patient's allergies indicates no known allergies.  No family history on file.  Social History Social History  Substance Use Topics  . Smoking status: Current Every Day Smoker    Packs/day: 2.00    Types: Cigarettes  . Smokeless tobacco: Never Used  . Alcohol use No    Review of Systems Constitutional: No fever/chills Eyes: No visual changes. ENT: No sore throat. Cardiovascular: Denies chest pain. Respiratory: Denies shortness of breath. Gastrointestinal:   No nausea, no vomiting.  No diarrhea.  No constipation. Genitourinary: Negative for dysuria. Musculoskeletal: Negative for back pain. Skin: Negative for rash. Neurological: Negative for headaches, focal weakness or numbness.  10-point ROS otherwise negative.  ____________________________________________   PHYSICAL EXAM:  VITAL SIGNS: ED Triage Vitals  Enc Vitals Group     BP 06/29/16 1814 (!) 185/111     Pulse Rate 06/29/16 1814 73     Resp 06/29/16 1814 16     Temp 06/29/16 1814 98 F (36.7 C)     Temp Source 06/29/16 1814 Oral     SpO2 06/29/16 1814 99 %     Weight 06/29/16 1813 180 lb (81.6 kg)     Height 06/29/16 1813 5\' 8"  (1.727 m)     Head Circumference --      Peak Flow --      Pain Score 06/29/16 1813  4     Pain Loc --      Pain Edu? --      Excl. in GC? --     Constitutional: Alert and oriented. Well appearing and in no acute distress. Eyes: Conjunctivae are normal. PERRL. EOMI. Head: Atraumatic. Nose: No congestion/rhinnorhea. Mouth/Throat: Mucous membranes are moist.   Neck: No stridor.   Cardiovascular: Normal rate, regular rhythm. Grossly normal heart sounds.   Respiratory: Normal respiratory effort.  No retractions. Lungs CTAB. Gastrointestinal: Soft With mild right lower quadrant tenderness palpation. No distention.  No CVA tenderness. Genitourinary: Normal gross examination in this uncircumcised male. No penile or  testicular swelling. Upon palpation there is no tenderness to the testicles bilaterally nor is there tenderness to other structures in the scrotum. No hernia sacs palpated. Musculoskeletal: No lower extremity tenderness nor edema.  No joint effusions. Neurologic:  Normal speech and language. No gross focal neurologic deficits are appreciated. No gait instability. Skin:  Skin is warm, dry and intact. No rash noted. Psychiatric: Mood and affect are normal. Speech and behavior are normal.  ____________________________________________   LABS (all labs ordered are listed, but only abnormal results are displayed)  Labs Reviewed  COMPREHENSIVE METABOLIC PANEL - Abnormal; Notable for the following:       Result Value   Total Bilirubin <0.1 (*)    All other components within normal limits  URINALYSIS COMPLETEWITH MICROSCOPIC ([facility] ONLY) - Abnormal; Notable for the following:    Color, Urine COLORLESS (*)    APPearance CLEAR (*)    Specific Gravity, Urine 1.004 (*)    Hgb urine dipstick 2+ (*)    All other components within normal limits  LIPASE, BLOOD  CBC   ____________________________________________  EKG   ____________________________________________  RADIOLOGY   ____________________________________________   PROCEDURES  Procedure(s) performed:   Procedures  Critical Care performed:   ____________________________________________   INITIAL IMPRESSION / ASSESSMENT AND PLAN / ED COURSE  Pertinent labs & imaging results that were available during my care of the patient were reviewed by me and considered in my medical decision making (see chart for details).  ----------------------------------------- 8:12 PM on 06/29/2016 -----------------------------------------  Patient pending CAT scan at this time. Patient with hematuria and right lower quadrant pain radiating into the pelvis. Possible kidney stone. Also possibly with groin strain. Less likely to be hernia as this  was not palpated on the exam. However, CAT scan will need to be followed. Signed out to Dr. Sharma CovertNorman.  Clinical Course     ____________________________________________   FINAL CLINICAL IMPRESSION(S) / ED DIAGNOSES  Final diagnoses:  Right lower quadrant abdominal pain  Hematuria, unspecified type      NEW MEDICATIONS STARTED DURING THIS VISIT:  New Prescriptions   ONDANSETRON (ZOFRAN) 4 MG TABLET    Take 1 tablet (4 mg total) by mouth daily as needed.     Note:  This document was prepared using Dragon voice recognition software and may include unintentional dictation errors.    Myrna Blazeravid Matthew Schaevitz, MD 06/29/16 2012

## 2016-06-29 NOTE — ED Triage Notes (Signed)
Pt reports lower abdominal pain since Saturday. Had appt with PCP but PCP canceled until next Tuesday. Pt denies N/V/D. Last normal BM was this AM. Pt A/O.

## 2016-06-29 NOTE — ED Notes (Signed)
Pt denies having N/V/D.

## 2016-06-29 NOTE — Discharge Instructions (Signed)
Today, a small kidney stone was found in your right kidney. This is not the cause of your pain.  Please drink plenty of fluids stay well-hydrated. Return to the emergency department if you develop worsening pain, fever, inability to keep down fluids, or any other symptoms concerning to you.

## 2016-08-31 ENCOUNTER — Emergency Department: Payer: BLUE CROSS/BLUE SHIELD

## 2016-08-31 ENCOUNTER — Emergency Department
Admission: EM | Admit: 2016-08-31 | Discharge: 2016-08-31 | Disposition: A | Discharge status: Home / Self Care | Payer: BLUE CROSS/BLUE SHIELD | Attending: Emergency Medicine | Admitting: Emergency Medicine

## 2016-08-31 ENCOUNTER — Encounter: Payer: Self-pay | Admitting: Emergency Medicine

## 2016-08-31 DIAGNOSIS — Z79899 Other long term (current) drug therapy: Secondary | ICD-10-CM | POA: Insufficient documentation

## 2016-08-31 DIAGNOSIS — R109 Unspecified abdominal pain: Secondary | ICD-10-CM | POA: Insufficient documentation

## 2016-08-31 DIAGNOSIS — F17210 Nicotine dependence, cigarettes, uncomplicated: Secondary | ICD-10-CM | POA: Diagnosis not present

## 2016-08-31 DIAGNOSIS — I10 Essential (primary) hypertension: Secondary | ICD-10-CM | POA: Diagnosis not present

## 2016-08-31 HISTORY — DX: Calculus of kidney: N20.0

## 2016-08-31 LAB — CBC
HEMATOCRIT: 45 % (ref 40.0–52.0)
HEMOGLOBIN: 15.7 g/dL (ref 13.0–18.0)
MCH: 32.4 pg (ref 26.0–34.0)
MCHC: 34.9 g/dL (ref 32.0–36.0)
MCV: 92.8 fL (ref 80.0–100.0)
Platelets: 214 10*3/uL (ref 150–440)
RBC: 4.85 MIL/uL (ref 4.40–5.90)
RDW: 13.6 % (ref 11.5–14.5)
WBC: 12 10*3/uL — ABNORMAL HIGH (ref 3.8–10.6)

## 2016-08-31 LAB — URINALYSIS, COMPLETE (UACMP) WITH MICROSCOPIC
BACTERIA UA: NONE SEEN
Bilirubin Urine: NEGATIVE
Glucose, UA: NEGATIVE mg/dL
KETONES UR: NEGATIVE mg/dL
LEUKOCYTES UA: NEGATIVE
Nitrite: NEGATIVE
PROTEIN: NEGATIVE mg/dL
SQUAMOUS EPITHELIAL / LPF: NONE SEEN
Specific Gravity, Urine: 1.006 (ref 1.005–1.030)
pH: 7 (ref 5.0–8.0)

## 2016-08-31 LAB — BASIC METABOLIC PANEL
ANION GAP: 8 (ref 5–15)
BUN: 15 mg/dL (ref 6–20)
CHLORIDE: 106 mmol/L (ref 101–111)
CO2: 26 mmol/L (ref 22–32)
Calcium: 9.1 mg/dL (ref 8.9–10.3)
Creatinine, Ser: 0.94 mg/dL (ref 0.61–1.24)
GFR calc non Af Amer: >60 mL/min (ref >60)
Glucose, Bld: 87 mg/dL (ref 65–99)
POTASSIUM: 3.6 mmol/L (ref 3.5–5.1)
SODIUM: 140 mmol/L (ref 135–145)

## 2016-08-31 NOTE — ED Triage Notes (Signed)
Pt c/o right flank pain radiating to groin. Was told had stone in October and he doesn't think he passed it and remains to c/o same pain that has never went away. Denies fevers.

## 2016-08-31 NOTE — Discharge Instructions (Signed)
CT showed stable non obstructing kidney stone. CT showed probable cyst but follow up with urology to rule out mass.

## 2016-08-31 NOTE — ED Provider Notes (Signed)
[facility] Emergency Department Provider Note  ____________________________________________  Time seen: Approximately 8:08 PM  I have reviewed the triage vital signs and the nursing notes.   HISTORY  Chief Complaint Flank Pain    HPI Douglas Barton is a 45 y.o. male presents to the emergency department with lower right abdominal pain that radiates to groin. Patient went to [facility] clinic at 5 PM tonight and was told to come to emergency room. Patient states the pain has been present since October. Patient was seen in emergency department in October and was told that he has a right kidney stone. Patient does not think that stone passed. Patient had appendectomy.   Past Medical History:  Diagnosis Date  . Hypertension   . Kidney stone     There are no active problems to display for this patient.   Past Surgical History:  Procedure Laterality Date  . APPENDECTOMY      Prior to Admission medications   Medication Sig Start Date End Date Taking? Authorizing Provider  albuterol (PROVENTIL HFA;VENTOLIN HFA) 108 (90 BASE) MCG/ACT inhaler Inhale 4-6 puffs by mouth every 4 hours as needed for wheezing, cough, and/or shortness of breath 03/25/15   Loleta Rose, MD  naproxen (NAPROSYN) 500 MG tablet Take 1 tablet (500 mg total) by mouth 2 (two) times daily with a meal. 05/04/16   Jami L Hagler, PA-C  ondansetron (ZOFRAN) 4 MG tablet Take 1 tablet (4 mg total) by mouth daily as needed. 06/29/16   Myrna Blazer, MD  propranolol (INDERAL) 20 MG tablet Take 1 tablet by mouth 2 (two) times daily. 06/15/16   Historical Provider, MD    Allergies Patient has no known allergies.  History reviewed. No pertinent family history.  Social History Social History  Substance Use Topics  . Smoking status: Current Every Day Smoker    Packs/day: 2.00    Types: Cigarettes  . Smokeless tobacco: Never Used  . Alcohol use No     Review of Systems  Constitutional:  No fever/chills ENT: No upper respiratory complaints. Cardiovascular: No chest pain. Respiratory: No cough. No SOB. Gastrointestinal: No abdominal pain.  No nausea, no vomiting.  *Genitourinary: Negative for dysuria. Musculoskeletal: Negative for musculoskeletal pain. Skin: Negative for rash, abrasions, lacerations, ecchymosis.    ____________________________________________   PHYSICAL EXAM:  VITAL SIGNS: ED Triage Vitals  Enc Vitals Group     BP 08/31/16 1810 (!) 147/92     Pulse Rate 08/31/16 1810 78     Resp 08/31/16 1810 16     Temp 08/31/16 1810 98.2 F (36.8 C)     Temp Source 08/31/16 1810 Oral     SpO2 08/31/16 1810 98 %     Weight 08/31/16 1807 180 lb (81.6 kg)     Height 08/31/16 1807 5\' 8"  (1.727 m)     Head Circumference --      Peak Flow --      Pain Score 08/31/16 1807 5     Pain Loc --      Pain Edu? --      Excl. in GC? --      Constitutional: Alert and oriented. Well appearing and in no acute distress. Eyes: Conjunctivae are normal. PERRL. EOMI. Head: Atraumatic. ENT:      Ears:      Nose: No congestion/rhinnorhea.      Mouth/Throat: Mucous membranes are moist.  Neck: No stridor.   Cardiovascular: Normal rate, regular rhythm. Normal S1 and S2.  Good  peripheral circulation. Respiratory: Normal respiratory effort without tachypnea or retractions. Lungs CTAB. Good air entry to the bases with no decreased or absent breath sounds. Gastrointestinal: Bowel sounds 4 quadrants. No numbness to palpation in right lower quadrant. No guarding or rigidity. No palpable masses. No distention.  Musculoskeletal: Full range of motion to all extremities. No gross deformities appreciated. Neurologic:  Normal speech and language. No gross focal neurologic deficits are appreciated.  Skin:  Skin is warm, dry and intact. No rash noted. Psychiatric: Mood and affect are normal. Speech and behavior are normal. Patient exhibits appropriate insight and  judgement.   ____________________________________________   LABS (all labs ordered are listed, but only abnormal results are displayed)  Labs Reviewed  URINALYSIS, COMPLETE (UACMP) WITH MICROSCOPIC - Abnormal; Notable for the following:       Result Value   Color, Urine STRAW (*)    APPearance CLEAR (*)    Hgb urine dipstick MODERATE (*)    All other components within normal limits  CBC - Abnormal; Notable for the following:    WBC 12.0 (*)    All other components within normal limits  BASIC METABOLIC PANEL   ____________________________________________  EKG   ____________________________________________  RADIOLOGY Lexine BatonI, Moody Robben, personally viewed and evaluated these images as part of my medical decision making, as well as reviewing the written report by the radiologist.  Ct Renal Stone Study  Result Date: 08/31/2016 CLINICAL DATA:  Right flank pain. EXAM: CT ABDOMEN AND PELVIS WITHOUT CONTRAST TECHNIQUE: Multidetector CT imaging of the abdomen and pelvis was performed following the standard protocol without IV contrast. COMPARISON:  CT scan of June 29, 2016. FINDINGS: Lower chest: No acute abnormality. Hepatobiliary: No focal liver abnormality is seen. No gallstones, gallbladder wall thickening, or biliary dilatation. Pancreas: Unremarkable. No pancreatic ductal dilatation or surrounding inflammatory changes. Spleen: Normal in size without focal abnormality. Adrenals/Urinary Tract: Adrenal glands appear normal. Stable nonobstructive calculus seen in upper pole collecting system of right kidney. No hydronephrosis or renal obstruction is noted. No ureteral calculi are noted. Stable 1.6 mm left renal low density most consistent with cyst. Urinary bladder is unremarkable. Stomach/Bowel: There is no evidence of bowel obstruction. Vascular/Lymphatic: Aortic atherosclerosis. No enlarged abdominal or pelvic lymph nodes. Reproductive: Prostate is unremarkable. Other: No abdominal wall  hernia or abnormality. No abdominopelvic ascites. Musculoskeletal: Multilevel degenerative disc disease is noted in lower lumbar spine. IMPRESSION: Stable nonobstructive right renal calculus. No hydronephrosis or renal obstruction is noted. Stable 1.6 cm low density seen in left kidney most consistent with cysts, but renal ultrasound is recommended for confirmation to rule out mass. Electronically Signed   By: Lupita RaiderJames  Green Jr, M.D.   On: 08/31/2016 20:50    ____________________________________________    PROCEDURES  Procedure(s) performed:    Procedures    Medications - No data to display   ____________________________________________   INITIAL IMPRESSION / ASSESSMENT AND PLAN / ED COURSE  Pertinent labs & imaging results that were available during my care of the patient were reviewed by me and considered in my medical decision making (see chart for details).  Review of the [place] CSRS was performed in accordance of the [facility] prior to dispensing any controlled drugs.  Clinical Course     Patient presents to the emergency department with 3 months of lower right abdominal pain. Vital signs, labs, and exam are reassuring. CT shows probable renal cyst. Patient is to follow up with urology to rule out mass.  Patient is given ED precautions to  return to the ED for any worsening or new symptoms.   ____________________________________________  FINAL CLINICAL IMPRESSION(S) / ED DIAGNOSES  Final diagnoses:  Right flank pain      NEW MEDICATIONS STARTED DURING THIS VISIT:  Discharge Medication List as of 08/31/2016  9:40 PM          This chart was dictated using voice recognition software/Dragon. Despite best efforts to proofread, errors can occur which can change the meaning. Any change was purely unintentional.    Enid DerryAshley Leeam Cedrone, PA-C 08/31/16 2203    Emily FilbertJonathan E Williams, MD 08/31/16 [phone]

## 2016-09-06 ENCOUNTER — Encounter: Payer: Self-pay | Admitting: Urology

## 2016-09-06 ENCOUNTER — Ambulatory Visit (INDEPENDENT_AMBULATORY_CARE_PROVIDER_SITE_OTHER): Payer: BLUE CROSS/BLUE SHIELD | Admitting: Urology

## 2016-09-06 VITALS — BP 141/102 | HR 78 | Ht 68.0 in | Wt 178.1 lb

## 2016-09-06 DIAGNOSIS — K648 Other hemorrhoids: Secondary | ICD-10-CM | POA: Insufficient documentation

## 2016-09-06 DIAGNOSIS — N281 Cyst of kidney, acquired: Secondary | ICD-10-CM | POA: Diagnosis not present

## 2016-09-06 DIAGNOSIS — R3129 Other microscopic hematuria: Secondary | ICD-10-CM | POA: Diagnosis not present

## 2016-09-06 DIAGNOSIS — N200 Calculus of kidney: Secondary | ICD-10-CM | POA: Diagnosis not present

## 2016-09-06 DIAGNOSIS — R102 Pelvic and perineal pain: Secondary | ICD-10-CM

## 2016-09-06 DIAGNOSIS — Z720 Tobacco use: Secondary | ICD-10-CM | POA: Insufficient documentation

## 2016-09-06 LAB — MICROSCOPIC EXAMINATION: Bacteria, UA: NONE SEEN

## 2016-09-06 LAB — URINALYSIS, COMPLETE
Bilirubin, UA: NEGATIVE
Glucose, UA: NEGATIVE
Ketones, UA: NEGATIVE
LEUKOCYTES UA: NEGATIVE
Nitrite, UA: NEGATIVE
PH UA: 5.5 (ref 5.0–7.5)
PROTEIN UA: NEGATIVE
Specific Gravity, UA: 1.025 (ref 1.005–1.030)
Urobilinogen, Ur: 0.2 mg/dL (ref 0.2–1.0)

## 2016-09-06 LAB — BLADDER SCAN AMB NON-IMAGING: Scan Result: 18

## 2016-09-06 NOTE — Addendum Note (Signed)
Addended by: Mervin KungWILLIAMS, RAMONA K on: 09/06/2016 09:39 AM   Modules accepted: Orders

## 2016-09-06 NOTE — Progress Notes (Addendum)
09/06/2016 9:27 AM   Douglas Barton 1971/03/28 [id number]  Referring provider: Lauro Regulus, MD [address] [facility] [place], [place] [address]  Chief Complaint  Patient presents with  . New Patient (Initial Visit)    Kidney cyst    HPI: Patient is a 46 year old Caucasian male who presents today with his friend, Judeth Cornfield, as a follow up from [facility] ED after a renal cyst was seen on CT and for which the radiologist recommended renal ultrasound for follow up.    Patient was found to have microscopic hematuria on several occasions with 0-5 RBC's/hpf.  Patient  does have a prior history of microscopic hematuria.    He does have a prior history of nephrolithiasis, but he does not have a prior history of recurrent urinary tract infections, trauma to the genitourinary tract, BPH or malignancies of the genitourinary tract.   He does not have a family medical history of nephrolithiasis, malignancies of the genitourinary tract or hematuria.   Today, he is having symptoms of frequent urination, urgency and nocturia x 2.  His UA today demonstrates 11-30 RBC's/hpf.  .PVR is 18 mL.    He is experiencing suprapubic pain, abdominal pain and flank pain on the right side.  He denies any recent fevers, chills, nausea or vomiting.   He has had several non contrast CT over the last year.  He has a stable non obstructive right renal calculus and a stable 1.6 cm density seen in the left kidney.  I have independently reviewed the films.    He is a smoker with a 30 ppd history.    PMH: Past Medical History:  Diagnosis Date  . Hypertension   . Kidney stone     Surgical History: Past Surgical History:  Procedure Laterality Date  . APPENDECTOMY      Home Medications:  Allergies as of 09/06/2016      Reactions   Tramadol Nausea Only      Medication List       Accurate as of 09/06/16  9:27 AM. Always use your most recent med list.          albuterol 108 (90  Base) MCG/ACT inhaler Commonly known as:  PROVENTIL HFA;VENTOLIN HFA Inhale 4-6 puffs by mouth every 4 hours as needed for wheezing, cough, and/or shortness of breath   naproxen 500 MG tablet Commonly known as:  NAPROSYN Take 1 tablet (500 mg total) by mouth 2 (two) times daily with a meal.   ondansetron 4 MG tablet Commonly known as:  ZOFRAN Take 1 tablet (4 mg total) by mouth daily as needed.   propranolol 20 MG tablet Commonly known as:  INDERAL Take 1 tablet by mouth 2 (two) times daily.       Allergies:  Allergies  Allergen Reactions  . Tramadol Nausea Only    Family History: Family History  Problem Relation Age of Onset  . Prostate cancer Neg Hx   . Bladder Cancer Neg Hx   . Kidney cancer Neg Hx     Social History:  reports that he has been smoking Cigarettes.  He has been smoking about 2.00 packs per day. He has never used smokeless tobacco. He reports that he does not drink alcohol or use drugs.  ROS: UROLOGY Frequent Urination?: Yes Hard to postpone urination?: Yes Burning/pain with urination?: No Get up at night to urinate?: Yes Leakage of urine?: No Urine stream starts and stops?: No Trouble starting  stream?: No Do you have to strain to urinate?: No Blood in urine?: No Urinary tract infection?: No Sexually transmitted disease?: No Injury to kidneys or bladder?: No Painful intercourse?: No Weak stream?: No Erection problems?: No Penile pain?: No  Gastrointestinal Nausea?: Yes Vomiting?: No Indigestion/heartburn?: Yes Diarrhea?: No Constipation?: No  Constitutional Fever: No Night sweats?: No Weight loss?: Yes Fatigue?: Yes  Skin Skin rash/lesions?: Yes Itching?: Yes  Eyes Blurred vision?: Yes Double vision?: No  Ears/Nose/Throat Sore throat?: No Sinus problems?: Yes  Hematologic/Lymphatic Swollen glands?: Yes Easy bruising?: Yes  Cardiovascular Leg swelling?: No Chest pain?: Yes  Respiratory Cough?: Yes Shortness of  breath?: Yes  Endocrine Excessive thirst?: No  Musculoskeletal Back pain?: Yes Joint pain?: No  Neurological Headaches?: Yes Dizziness?: Yes  Psychologic Depression?: Yes Anxiety?: Yes  Physical Exam: BP (!) 141/102 (BP Location: Right Arm, Patient Position: Sitting, Cuff Size: Normal)   Pulse 78   Ht 5\' 8"  (1.727 m)   Wt 178 lb 1.6 oz (80.8 kg)   BMI 27.08 kg/m   Constitutional: Well nourished. Alert and oriented, No acute distress. HEENT: [place] AT, moist mucus membranes. Trachea midline, no masses. Cardiovascular: No clubbing, cyanosis, or edema. Respiratory: Normal respiratory effort, no increased work of breathing. GI: Abdomen is soft, non tender, non distended, no abdominal masses. Liver and spleen not palpable.  No hernias appreciated.  Stool sample for occult testing is not indicated.   GU: No CVA tenderness.  No bladder fullness or masses.  Patient with circumcised phallus. Urethral meatus is patent.  No penile discharge. No penile lesions or rashes. Scrotum without lesions, cysts, rashes and/or edema.  Testicles are located scrotally bilaterally. No masses are appreciated in the testicles. Left and right epididymis are normal. Rectal: Patient with  normal sphincter tone. Anus and perineum without scarring or rashes. No rectal masses are appreciated. Prostate is approximately 45 grams, no nodules are appreciated. Seminal vesicles are normal. Skin: No rashes, bruises or suspicious lesions. Lymph: No cervical or inguinal adenopathy. Neurologic: Grossly intact, no focal deficits, moving all 4 extremities. Psychiatric: Normal mood and affect.  Laboratory Data: Lab Results  Component Value Date   WBC 12.0 (H) 08/31/2016   HGB 15.7 08/31/2016   HCT 45.0 08/31/2016   MCV 92.8 08/31/2016   PLT 214 08/31/2016    Lab Results  Component Value Date   CREATININE 0.94 08/31/2016    Lab Results  Component Value Date   AST 26 06/29/2016   Lab Results  Component Value  Date   ALT 32 06/29/2016    Urinalysis 11-30RBC's.  See EPIC.    Pertinent Imaging: CLINICAL DATA:  Right flank pain.  EXAM: CT ABDOMEN AND PELVIS WITHOUT CONTRAST  TECHNIQUE: Multidetector CT imaging of the abdomen and pelvis was performed following the standard protocol without IV contrast.  COMPARISON:  CT scan of June 29, 2016.  FINDINGS: Lower chest: No acute abnormality.  Hepatobiliary: No focal liver abnormality is seen. No gallstones, gallbladder wall thickening, or biliary dilatation.  Pancreas: Unremarkable. No pancreatic ductal dilatation or surrounding inflammatory changes.  Spleen: Normal in size without focal abnormality.  Adrenals/Urinary Tract: Adrenal glands appear normal. Stable nonobstructive calculus seen in upper pole collecting system of right kidney. No hydronephrosis or renal obstruction is noted. No ureteral calculi are noted. Stable 1.6 mm left renal low density most consistent with cyst. Urinary bladder is unremarkable.  Stomach/Bowel: There is no evidence of bowel obstruction.  Vascular/Lymphatic: Aortic atherosclerosis. No enlarged abdominal or pelvic lymph nodes.  Reproductive: Prostate is unremarkable.  Other: No abdominal wall hernia or abnormality. No abdominopelvic ascites.  Musculoskeletal: Multilevel degenerative disc disease is noted in lower lumbar spine.  IMPRESSION: Stable nonobstructive right renal calculus. No hydronephrosis or renal obstruction is noted.  Stable 1.6 cm low density seen in left kidney most consistent with cysts, but renal ultrasound is recommended for confirmation to rule out mass.   Electronically Signed   By: Lupita Raider, M.D.   On: 08/31/2016 20:50  Results for SIRE, POET (MRN [id number]) as of 09/06/2016 09:39  Ref. Range 09/06/2016 09:04  Scan Result Unknown 18    Assessment & Plan:    1. Microscopic hematuria  - I explained to the patient that there are a  number of causes that can be associated with blood in the urine, such as stones, BPH, UTI's, damage to the urinary tract and/or cancer.  - At this time, I felt that the patient warranted further urologic evaluation.   The AUA guidelines state that a CT urogram is the preferred imaging study to evaluate hematuria.  - I explained to the patient that a contrast material will be injected into a vein and that in rare instances, an allergic reaction can result and may even life threatening   The patient denies any allergies to contrast, iodine and/or seafood and is not taking metformin.  - I explained that a non-contrast CT would lack the detail of excluding some urologic tumors.    - Following the imaging study,  I've recommended a cystoscopy. I described how this is performed, typically in an office setting with a flexible cystoscope. We described the risks, benefits, and possible side effects, the most common of which is a minor amount of blood in the urine and/or burning which usually resolves in 24 to 48 hours.    - The patient had the opportunity to ask questions which were answered. Based upon this discussion, the patient is willing to proceed. Therefore, I've ordered: a CT Urogram and cystoscopy.  - The patient will return following all of the above for discussion of the results.     - BUN + creatinine  - UA  - urine culture  2. Right renal calculus  - stable renal calculus  - CT Urogram pending  3. Left renal cyst  - stable renal cyst  - CT Urogram pending  Return for CT Urogram report and cystoscopy.  These notes generated with voice recognition software. I apologize for typographical errors.  Michiel Cowboy, PA-C  [facility] [address] [place], [place] [address] [phone]

## 2016-09-09 LAB — CULTURE, URINE COMPREHENSIVE

## 2016-09-21 ENCOUNTER — Ambulatory Visit: Admission: RE | Admit: 2016-09-21 | Payer: BLUE CROSS/BLUE SHIELD | Source: Ambulatory Visit

## 2016-09-28 ENCOUNTER — Ambulatory Visit
Admission: RE | Admit: 2016-09-28 | Discharge: 2016-09-28 | Disposition: A | Discharge status: Home / Self Care | Payer: BLUE CROSS/BLUE SHIELD | Source: Ambulatory Visit | Attending: Urology | Admitting: Urology

## 2016-09-28 DIAGNOSIS — R3129 Other microscopic hematuria: Secondary | ICD-10-CM

## 2016-09-28 DIAGNOSIS — N281 Cyst of kidney, acquired: Secondary | ICD-10-CM | POA: Diagnosis not present

## 2016-09-28 DIAGNOSIS — N200 Calculus of kidney: Secondary | ICD-10-CM | POA: Insufficient documentation

## 2016-09-28 MED ORDER — IOPAMIDOL (ISOVUE-300) INJECTION 61%
125.0000 mL | Freq: Once | INTRAVENOUS | Status: AC | PRN
  Administered 2016-09-28: 125 mL via INTRAVENOUS

## 2016-10-03 ENCOUNTER — Ambulatory Visit: Payer: BLUE CROSS/BLUE SHIELD | Admitting: Urology

## 2016-10-03 ENCOUNTER — Encounter: Payer: Self-pay | Admitting: Urology

## 2016-10-03 VITALS — BP 154/103 | HR 67 | Ht 68.0 in | Wt 179.5 lb

## 2016-10-03 DIAGNOSIS — R3129 Other microscopic hematuria: Secondary | ICD-10-CM

## 2016-10-03 LAB — URINALYSIS, COMPLETE
Bilirubin, UA: NEGATIVE
Glucose, UA: NEGATIVE
Ketones, UA: NEGATIVE
Leukocytes, UA: NEGATIVE
Nitrite, UA: NEGATIVE
PH UA: 7 (ref 5.0–7.5)
PROTEIN UA: NEGATIVE
Specific Gravity, UA: 1.01 (ref 1.005–1.030)
Urobilinogen, Ur: 0.2 mg/dL (ref 0.2–1.0)

## 2016-10-03 LAB — MICROSCOPIC EXAMINATION
BACTERIA UA: NONE SEEN
Epithelial Cells (non renal): NONE SEEN /hpf (ref 0–10)
WBC UA: NONE SEEN /HPF (ref 0–?)

## 2016-10-03 MED ORDER — CIPROFLOXACIN HCL 500 MG PO TABS
500.0000 mg | ORAL_TABLET | Freq: Once | ORAL | Status: AC
  Administered 2016-10-03: 500 mg via ORAL

## 2016-10-03 MED ORDER — LIDOCAINE HCL 2 % EX GEL
1.0000 "application " | Freq: Once | CUTANEOUS | Status: AC
  Administered 2016-10-03: 1 via URETHRAL

## 2016-10-03 NOTE — Progress Notes (Signed)
   10/03/16  CC:  Chief Complaint  Patient presents with  . Cysto    microscopic hematuria     HPI: F/u several GU issues -- Patient is a 46 year old Caucasian male [facility] 2017 after renal cyst and punctate stone on CT and for which the radiologist recommended renal ultrasound for follow up. He has had several non contrast CT over the last year.  He has a stable non obstructive right renal calculus (punctate) and a stable 1.6 cm density seen in the left kidney.   Patient was found to have microscopic hematuria on several occasions with 0-5 RBC's/hpf.  Patient  does have a prior history of microscopic hematuria.  Recheck of UA in office revealed 11-30 RBC's/hpf. To be complete, a CT with IV contrast/hematuria was ordered and completed 09/28/2016. This confirmed a left renal cyst and no worrisome GU findings. I reviewed the images today.   He was having frequent urination, urgency and nocturia x 2. RLQ pain, right flank pain radiating to groin. PVR was 18 mL. DRE was normal Jan 2018.     He does have a prior history of nephrolithiasis, but he does not have a prior history of recurrent urinary tract infections, trauma to the genitourinary tract, BPH or malignancies of the genitourinary tract.   He does not have a family medical history of nephrolithiasis, malignancies of the genitourinary tract or hematuria.   Patient returns for cystoscopy. Pain has improved. No dysuria.      He is a smoker with a 30 ppd history.  There were no vitals taken for this visit. NED. A&Ox3.   No respiratory distress   Abd soft, NT, ND Normal phallus with bilateral descended testicles  Cystoscopy Procedure Note  Patient identification was confirmed, informed consent was obtained, and patient was prepped using Betadine solution.  Lidocaine jelly was administered per urethral meatus.    Preoperative abx where received prior to procedure.     Pre-Procedure: - Inspection reveals a  normal caliber ureteral meatus.  Procedure: The flexible cystoscope was introduced without difficulty - No urethral strictures/lesions are present. - prostate short, non-obstructive - bladder neck normal - Bilateral ureteral orifices identified - clear efflux  - Bladder mucosa  reveals no ulcers, tumors, or lesions - No bladder stones - No trabeculation  Retroflexion shows normal bladder neck.    Post-Procedure: - Patient tolerated the procedure well  Assessment/ Plan:  1) nephrolithiasis - reassured, small - likely not clinically significant  2) MH - benign evaluation - see in 1 year for Ua, symptom check or sooner if any symptoms (pain, gross hematuria, etc.). Discussed importance of f/u.   3) renal cyst - benign

## 2017-05-16 ENCOUNTER — Other Ambulatory Visit: Payer: Self-pay | Admitting: Orthopedic Surgery

## 2017-05-16 DIAGNOSIS — M5412 Radiculopathy, cervical region: Secondary | ICD-10-CM

## 2017-05-23 ENCOUNTER — Ambulatory Visit
Admission: RE | Admit: 2017-05-23 | Discharge: 2017-05-23 | Disposition: A | Discharge status: Home / Self Care | Payer: BLUE CROSS/BLUE SHIELD | Source: Ambulatory Visit | Attending: Orthopedic Surgery | Admitting: Orthopedic Surgery

## 2017-05-23 DIAGNOSIS — M50123 Cervical disc disorder at C6-C7 level with radiculopathy: Secondary | ICD-10-CM | POA: Diagnosis not present

## 2017-05-23 DIAGNOSIS — M2578 Osteophyte, vertebrae: Secondary | ICD-10-CM | POA: Diagnosis not present

## 2017-05-23 DIAGNOSIS — M5412 Radiculopathy, cervical region: Secondary | ICD-10-CM | POA: Diagnosis present

## 2017-05-23 DIAGNOSIS — M4802 Spinal stenosis, cervical region: Secondary | ICD-10-CM | POA: Insufficient documentation

## 2017-05-23 DIAGNOSIS — M50221 Other cervical disc displacement at C4-C5 level: Secondary | ICD-10-CM | POA: Diagnosis not present

## 2017-06-12 ENCOUNTER — Other Ambulatory Visit: Payer: Self-pay | Admitting: Orthopedic Surgery

## 2017-06-12 DIAGNOSIS — M5030 Other cervical disc degeneration, unspecified cervical region: Secondary | ICD-10-CM

## 2017-06-22 ENCOUNTER — Other Ambulatory Visit: Payer: Self-pay | Admitting: Orthopedic Surgery

## 2017-06-22 ENCOUNTER — Ambulatory Visit
Admission: RE | Admit: 2017-06-22 | Discharge: 2017-06-22 | Disposition: A | Discharge status: Home / Self Care | Payer: BLUE CROSS/BLUE SHIELD | Source: Ambulatory Visit | Attending: Orthopedic Surgery | Admitting: Orthopedic Surgery

## 2017-06-22 DIAGNOSIS — M5030 Other cervical disc degeneration, unspecified cervical region: Secondary | ICD-10-CM

## 2017-06-22 DIAGNOSIS — M5412 Radiculopathy, cervical region: Secondary | ICD-10-CM

## 2017-06-22 MED ORDER — IOPAMIDOL (ISOVUE-M 300) INJECTION 61%: 1.0000 mL | Freq: Once | INTRAMUSCULAR | Status: DC | PRN

## 2017-06-22 MED ORDER — TRIAMCINOLONE ACETONIDE 40 MG/ML IJ SUSP (RADIOLOGY): 60.0000 mg | Freq: Once | INTRAMUSCULAR | Status: DC

## 2017-07-06 ENCOUNTER — Ambulatory Visit
Admission: RE | Admit: 2017-07-06 | Discharge: 2017-07-06 | Disposition: A | Discharge status: Home / Self Care | Payer: BLUE CROSS/BLUE SHIELD | Source: Ambulatory Visit | Attending: Orthopedic Surgery | Admitting: Orthopedic Surgery

## 2017-07-06 DIAGNOSIS — M5412 Radiculopathy, cervical region: Secondary | ICD-10-CM

## 2017-07-06 MED ORDER — TRIAMCINOLONE ACETONIDE 40 MG/ML IJ SUSP (RADIOLOGY)
60.0000 mg | Freq: Once | INTRAMUSCULAR | Status: AC
  Administered 2017-07-06: 60 mg via EPIDURAL

## 2017-07-06 MED ORDER — IOPAMIDOL (ISOVUE-M 300) INJECTION 61%
1.0000 mL | Freq: Once | INTRAMUSCULAR | Status: AC | PRN
  Administered 2017-07-06: 1 mL via EPIDURAL

## 2017-07-06 NOTE — Discharge Instructions (Signed)
Post Procedure Spinal Discharge Instruction Sheet  1. You may resume a regular diet and any medications that you routinely take (including pain medications).  2. No driving day of procedure.  3. Light activity throughout the rest of the day.  Do not do any strenuous work, exercise, bending or lifting.  The day following the procedure, you can resume normal physical activity but you should refrain from exercising or physical therapy for at least three days thereafter.   Common Side Effects:   Headaches- take your usual medications as directed by your physician.  Increase your fluid intake.  Caffeinated beverages may be helpful.  Lie flat in bed until your headache resolves.   Restlessness or inability to sleep- you may have trouble sleeping for the next few days.  Ask your referring physician if you need any medication for sleep.   Facial flushing or redness- should subside within a few days.   Increased pain- a temporary increase in pain a day or two following your procedure is not unusual.  Take your pain medication as prescribed by your referring physician.   Leg cramps  Please contact our office at [phone] for the following symptoms:  Fever greater than 100 degrees.  Headaches unresolved with medication after 2-3 days.  Increased swelling, pain, or redness at injection site.  Thank you for visiting our office.   May resume Goody's and BC's today.

## 2017-10-03 ENCOUNTER — Ambulatory Visit: Payer: BLUE CROSS/BLUE SHIELD | Admitting: Urology

## 2017-10-15 NOTE — Progress Notes (Signed)
10/16/2017 3:44 PM   Douglas Barton 29-Dec-1970 [id number]  Referring provider: Lauro Regulus, MD [address] [facility] [place] [facility] [place], [place] [address]  Chief Complaint  Patient presents with  . Hematuria    HPI: Patient is a 47 year old Caucasian male with a history of hematuria who presents today for a one year follow up.    CTU performed in 09/2016 noted tiny 1-2 mm nonobstructing bilateral renal calculi. No evidence of ureteral calculi, hydronephrosis, or other acute findings.  Small benign left renal cyst. No radiographic evidence of urinary tract neoplasm.   Cystoscopy completed on 10/02/2016 with Dr. Mena Goes was negative.   He does not have a prior history of recurrent urinary tract infections, trauma to the genitourinary tract, BPH or malignancies of the genitourinary tract.   He does not have a family medical history of nephrolithiasis, malignancies of the genitourinary tract or hematuria.   Today, he is having symptoms of frequent urination.   His UA today demonstrates 3-10 RBC's/hpf.   He is experiencing suprapubic pain, abdominal pain and flank pain on the right side.  He denies any recent fevers, chills, nausea or vomiting.   He is a smoker with a 30 ppd history.    PMH: Past Medical History:  Diagnosis Date  . Hypertension   . Kidney stone     Surgical History: Past Surgical History:  Procedure Laterality Date  . APPENDECTOMY      Home Medications:  Allergies as of 10/16/2017      Reactions   Tramadol Nausea Only      Medication List        Accurate as of 10/16/17  3:44 PM. Always use your most recent med list.          albuterol 108 (90 Base) MCG/ACT inhaler Commonly known as:  PROVENTIL HFA;VENTOLIN HFA Inhale 4-6 puffs by mouth every 4 hours as needed for wheezing, cough, and/or shortness of breath   amLODipine 5 MG tablet Commonly known as:  NORVASC Take by mouth.   naproxen 500 MG tablet Commonly known as:   NAPROSYN Take 1 tablet (500 mg total) by mouth 2 (two) times daily with a meal.   ondansetron 4 MG tablet Commonly known as:  ZOFRAN Take 1 tablet (4 mg total) by mouth daily as needed.   predniSONE 10 MG tablet Commonly known as:  DELTASONE 6 tabs x 1 day, 5 tabs x 1 day, 4 tabs x 1 day, etc...   propranolol 20 MG tablet Commonly known as:  INDERAL Take 1 tablet by mouth 2 (two) times daily.       Allergies:  Allergies  Allergen Reactions  . Tramadol Nausea Only    Family History: Family History  Problem Relation Age of Onset  . Prostate cancer Neg Hx   . Bladder Cancer Neg Hx   . Kidney cancer Neg Hx     Social History:  reports that he has been smoking cigarettes.  He has been smoking about 2.00 packs per day. he has never used smokeless tobacco. He reports that he does not drink alcohol or use drugs.  ROS: UROLOGY Frequent Urination?: Yes Hard to postpone urination?: No Burning/pain with urination?: No Get up at night to urinate?: No Leakage of urine?: No Urine stream starts and stops?: No Trouble starting stream?: No Do you have to strain to urinate?: No Blood in urine?: No Urinary tract infection?: No Sexually transmitted disease?: No Injury to kidneys  or bladder?: No Painful intercourse?: No Weak stream?: No Erection problems?: No Penile pain?: No  Gastrointestinal Nausea?: No Vomiting?: No Indigestion/heartburn?: No Diarrhea?: No Constipation?: No  Constitutional Fever: No Night sweats?: No Weight loss?: No Fatigue?: Yes  Skin Skin rash/lesions?: No Itching?: No  Eyes Blurred vision?: Yes Double vision?: No  Ears/Nose/Throat Sore throat?: No Sinus problems?: Yes  Hematologic/Lymphatic Swollen glands?: No Easy bruising?: No  Cardiovascular Leg swelling?: No Chest pain?: No  Respiratory Cough?: Yes Shortness of breath?: Yes  Endocrine Excessive thirst?: No  Musculoskeletal Back pain?: No Joint pain?:  No  Neurological Headaches?: Yes Dizziness?: Yes  Psychologic Depression?: No Anxiety?: No  Physical Exam: BP (!) 145/93   Pulse 89   Ht 5\' 8"  (1.727 m)   Wt 184 lb (83.5 kg)   BMI 27.98 kg/m   Constitutional: Well nourished. Alert and oriented, No acute distress. HEENT: [place] AT, moist mucus membranes. Trachea midline, no masses. Cardiovascular: No clubbing, cyanosis, or edema. Respiratory: Normal respiratory effort, no increased work of breathing. Skin: No rashes, bruises or suspicious lesions. Lymph: No cervical or inguinal adenopathy. Neurologic: Grossly intact, no focal deficits, moving all 4 extremities. Psychiatric: Normal mood and affect.  Laboratory Data: Lab Results  Component Value Date   WBC 12.0 (H) 08/31/2016   HGB 15.7 08/31/2016   HCT 45.0 08/31/2016   MCV 92.8 08/31/2016   PLT 214 08/31/2016    Lab Results  Component Value Date   CREATININE 0.94 08/31/2016    Lab Results  Component Value Date   AST 26 06/29/2016   Lab Results  Component Value Date   ALT 32 06/29/2016    Urinalysis 3-10 RBC's.   See EPIC.    I have reviewed the labs.   Assessment & Plan:    1. History of hematuria  - Hematuria work up completed in 09/2016 - no malignancies seen  - UA today positive for 3-10 RBC's  - no reports of gross hematuria  2. Right renal calculus  - stable renal calculus  - Advised to contact our office or seek treatment in the ED if becomes febrile or pain/ vomiting are difficult control in order to arrange for emergent/urgent intervention  3. Left renal cyst  - stable renal cyst   Return in about 1 year (around 10/16/2018) for UA and KUB.  These notes generated with voice recognition software. I apologize for typographical errors.  Michiel CowboySHANNON Latania Bascomb, PA-C  [facility] [address], [address] [place], [place] [address] [phone]

## 2017-10-16 ENCOUNTER — Encounter: Payer: Self-pay | Admitting: Urology

## 2017-10-16 ENCOUNTER — Ambulatory Visit (INDEPENDENT_AMBULATORY_CARE_PROVIDER_SITE_OTHER): Payer: BLUE CROSS/BLUE SHIELD | Admitting: Urology

## 2017-10-16 VITALS — BP 145/93 | HR 89 | Ht 68.0 in | Wt 184.0 lb

## 2017-10-16 DIAGNOSIS — Z87448 Personal history of other diseases of urinary system: Secondary | ICD-10-CM

## 2017-10-16 DIAGNOSIS — N200 Calculus of kidney: Secondary | ICD-10-CM

## 2017-10-16 DIAGNOSIS — N281 Cyst of kidney, acquired: Secondary | ICD-10-CM | POA: Diagnosis not present

## 2017-10-17 LAB — URINALYSIS, COMPLETE
Bilirubin, UA: NEGATIVE
GLUCOSE, UA: NEGATIVE
KETONES UA: NEGATIVE
Leukocytes, UA: NEGATIVE
NITRITE UA: NEGATIVE
PROTEIN UA: NEGATIVE
SPEC GRAV UA: 1.01 (ref 1.005–1.030)
UUROB: 0.2 mg/dL (ref 0.2–1.0)
pH, UA: 6.5 (ref 5.0–7.5)

## 2017-10-17 LAB — MICROSCOPIC EXAMINATION
Bacteria, UA: NONE SEEN
Epithelial Cells (non renal): NONE SEEN /hpf (ref 0–10)
WBC, UA: NONE SEEN /hpf (ref 0–?)

## 2017-11-14 ENCOUNTER — Ambulatory Visit
Admission: RE | Admit: 2017-11-14 | Discharge: 2017-11-14 | Disposition: A | Discharge status: Home / Self Care | Payer: BLUE CROSS/BLUE SHIELD | Source: Ambulatory Visit | Attending: Urology | Admitting: Urology

## 2017-11-14 ENCOUNTER — Encounter: Payer: Self-pay | Admitting: Urology

## 2017-11-14 ENCOUNTER — Ambulatory Visit (INDEPENDENT_AMBULATORY_CARE_PROVIDER_SITE_OTHER): Payer: BLUE CROSS/BLUE SHIELD | Admitting: Urology

## 2017-11-14 VITALS — BP 128/87 | HR 105 | Resp 16 | Ht 68.0 in | Wt 180.0 lb

## 2017-11-14 DIAGNOSIS — N200 Calculus of kidney: Secondary | ICD-10-CM | POA: Insufficient documentation

## 2017-11-14 DIAGNOSIS — N281 Cyst of kidney, acquired: Secondary | ICD-10-CM | POA: Diagnosis not present

## 2017-11-14 DIAGNOSIS — R109 Unspecified abdominal pain: Secondary | ICD-10-CM | POA: Diagnosis not present

## 2017-11-14 DIAGNOSIS — Z87448 Personal history of other diseases of urinary system: Secondary | ICD-10-CM | POA: Diagnosis not present

## 2017-11-14 DIAGNOSIS — K319 Disease of stomach and duodenum, unspecified: Secondary | ICD-10-CM | POA: Diagnosis not present

## 2017-11-14 DIAGNOSIS — K3189 Other diseases of stomach and duodenum: Secondary | ICD-10-CM

## 2017-11-14 LAB — MICROSCOPIC EXAMINATION
BACTERIA UA: NONE SEEN
Epithelial Cells (non renal): NONE SEEN /hpf (ref 0–10)
WBC UA: NONE SEEN /HPF (ref 0–?)

## 2017-11-14 LAB — URINALYSIS, COMPLETE
BILIRUBIN UA: NEGATIVE
Glucose, UA: NEGATIVE
Ketones, UA: NEGATIVE
Leukocytes, UA: NEGATIVE
NITRITE UA: NEGATIVE
PH UA: 7 (ref 5.0–7.5)
PROTEIN UA: NEGATIVE
Specific Gravity, UA: 1.025 (ref 1.005–1.030)
UUROB: 0.2 mg/dL (ref 0.2–1.0)

## 2017-11-14 NOTE — Progress Notes (Signed)
11/14/2017 2:09 PM   Brien Delano MetzL Daughdrill Nov 23, 1970 [id number]  Referring provider: Lauro RegulusAnderson, Marshall W, MD [address] [facility] [place], [place] [address]  No chief complaint on file.   HPI: 47 yo WM with a history of hematuria and bilateral nephrolithiasis who presents today for further evaluation of his right flank pain.  Background history Patient is a 47 year old Caucasian male with a history of hematuria who presents today for a one year follow up.  CTU performed in 09/2016 noted tiny 1-2 mm nonobstructing bilateral renal calculi. No evidence of ureteral calculi, hydronephrosis, or other acute findings.  Small benign left renal cyst. No radiographic evidence of urinary tract neoplasm.  Cystoscopy completed on 10/02/2016 with Dr. Mena GoesEskridge was negative.   He does not have a prior history of recurrent urinary tract infections, trauma to the genitourinary tract, BPH or malignancies of the genitourinary tract.  He does not have a family medical history of nephrolithiasis, malignancies of the genitourinary tract or hematuria.  He is a smoker with a 30 ppd history.    Today, he is experiencing 10/10 pain in his right flank.  This pain started yesterday.  He had been having bilateral mid back pain off and on for the last 2 weeks.   He also has been having night sweats for the last 2 weeks, but he denies fevers or chills.  He also denies nausea or vomiting.  Nothing helps the pain.  Nothing makes the pain worse.   His UA was positive for 3-10 RBC's.  He has been taking 6 to 8 BC's powders daily chronically.   PMH: Past Medical History:  Diagnosis Date  . Hypertension   . Kidney stone     Surgical History: Past Surgical History:  Procedure Laterality Date  . APPENDECTOMY      Home Medications:  Allergies as of 11/14/2017      Reactions   Tramadol Nausea Only      Medication List        Accurate as of 11/14/17  2:09 PM. Always use your most recent med list.          albuterol 108 (90 Base) MCG/ACT inhaler Commonly known as:  PROVENTIL HFA;VENTOLIN HFA Inhale 4-6 puffs by mouth every 4 hours as needed for wheezing, cough, and/or shortness of breath   amLODipine 5 MG tablet Commonly known as:  NORVASC Take by mouth.   naproxen 500 MG tablet Commonly known as:  NAPROSYN Take 1 tablet (500 mg total) by mouth 2 (two) times daily with a meal.   ondansetron 4 MG tablet Commonly known as:  ZOFRAN Take 1 tablet (4 mg total) by mouth daily as needed.   predniSONE 10 MG tablet Commonly known as:  DELTASONE 6 tabs x 1 day, 5 tabs x 1 day, 4 tabs x 1 day, etc...   propranolol 20 MG tablet Commonly known as:  INDERAL Take 1 tablet by mouth 2 (two) times daily.       Allergies:  Allergies  Allergen Reactions  . Tramadol Nausea Only    Family History: Family History  Problem Relation Age of Onset  . Prostate cancer Neg Hx   . Bladder Cancer Neg Hx   . Kidney cancer Neg Hx     Social History:  reports that he has been smoking cigarettes.  He has been smoking about 2.00 packs per day. he has never used smokeless tobacco. He reports that he does not drink  alcohol or use drugs.  ROS: UROLOGY Frequent Urination?: Yes Hard to postpone urination?: No Burning/pain with urination?: No Get up at night to urinate?: No Leakage of urine?: No Urine stream starts and stops?: No Trouble starting stream?: No Do you have to strain to urinate?: No Blood in urine?: No Urinary tract infection?: No Sexually transmitted disease?: No Injury to kidneys or bladder?: No Painful intercourse?: No Weak stream?: No Erection problems?: No Penile pain?: No  Gastrointestinal Nausea?: No Vomiting?: No Indigestion/heartburn?: Yes Diarrhea?: No Constipation?: No  Constitutional Fever: No Night sweats?: Yes Weight loss?: No Fatigue?: Yes  Skin Skin rash/lesions?: No Itching?: Yes  Eyes Blurred vision?: Yes Double vision?:  No  Ears/Nose/Throat Sore throat?: No Sinus problems?: No  Hematologic/Lymphatic Swollen glands?: No Easy bruising?: No  Cardiovascular Leg swelling?: No Chest pain?: No  Respiratory Cough?: No Shortness of breath?: No  Endocrine Excessive thirst?: No  Musculoskeletal Back pain?: Yes Joint pain?: Yes  Neurological Headaches?: Yes Dizziness?: Yes  Psychologic Depression?: No Anxiety?: No  Physical Exam: BP 128/87   Pulse (!) 105   Resp 16   Ht 5\' 8"  (1.727 m)   Wt 180 lb (81.6 kg)   SpO2 98%   BMI 27.37 kg/m   Constitutional: Well nourished. Alert and oriented, No acute distress. HEENT: [place] AT, moist mucus membranes. Trachea midline, no masses. Cardiovascular: No clubbing, cyanosis, or edema. Respiratory: Normal respiratory effort, no increased work of breathing. GI: Abdomen is soft, non tender, non distended, no abdominal masses. Liver and spleen not palpable.  No hernias appreciated.  Stool sample for occult testing is not indicated.   GU: Right CVA tenderness.  No bladder fullness or masses.  Skin: No rashes, bruises or suspicious lesions. Lymph: No cervical or inguinal adenopathy. Neurologic: Grossly intact, no focal deficits, moving all 4 extremities. Psychiatric: Normal mood and affect.  Laboratory Data: Lab Results  Component Value Date   WBC 12.0 (H) 08/31/2016   HGB 15.7 08/31/2016   HCT 45.0 08/31/2016   MCV 92.8 08/31/2016   PLT 214 08/31/2016    Lab Results  Component Value Date   CREATININE 0.94 08/31/2016    Lab Results  Component Value Date   AST 26 06/29/2016   Lab Results  Component Value Date   ALT 32 06/29/2016    Urinalysis 3-10 RBC's.   See EPIC.    I have reviewed the labs.   Assessment & Plan:    1. Acute right flank pain - will obtain a CT Renal stone study today - right renal calculus remains in place -may be due to his bilateral foraminal impingement at L4-5 and L5-S1 due to spondylosis and degenerative  disc disease - encouraged to follow up with his PCP  2. History of hematuria  - Hematuria work up completed in 09/2016 - no malignancies seen  - UA today positive for 3-10 RBC's - may be due to the excessive BC's powder use  - no reports of gross hematuria  3. Right renal calculus  - CT Renal stone pending - stone is stable  - Advised to contact our office or seek treatment in the ED if becomes febrile or pain/ vomiting are difficult control in order to arrange for emergent/urgent intervention  4. Left renal cyst  - stable renal cyst   5. Stomach mass - instructed to follow up with PCP as there is always a possibility of malignancy and needs further evaluation and monitoring    Return for keep scheduled follow ups.  These notes generated with voice recognition software. I apologize for typographical errors.  Zara Council, [facility] [address], [place], [place] [address] [phone]

## 2018-02-28 DIAGNOSIS — F411 Generalized anxiety disorder: Secondary | ICD-10-CM | POA: Insufficient documentation

## 2018-10-16 ENCOUNTER — Ambulatory Visit: Payer: BLUE CROSS/BLUE SHIELD | Admitting: Urology

## 2018-10-22 ENCOUNTER — Other Ambulatory Visit: Payer: Self-pay | Admitting: Urology

## 2018-10-22 ENCOUNTER — Ambulatory Visit: Payer: BLUE CROSS/BLUE SHIELD | Admitting: Urology

## 2018-10-22 ENCOUNTER — Telehealth: Payer: Self-pay

## 2018-10-22 DIAGNOSIS — N200 Calculus of kidney: Secondary | ICD-10-CM

## 2018-10-22 NOTE — Progress Notes (Signed)
KUB order in. 

## 2018-10-22 NOTE — Telephone Encounter (Signed)
Patient reminded to have KUB done prior to seeing shannon today.

## 2018-10-23 ENCOUNTER — Telehealth: Payer: Self-pay | Admitting: Urology

## 2018-10-23 NOTE — Telephone Encounter (Signed)
Please call Douglas Barton and have him reschedule his missed appointment from yesterday.  He will need a KUB prior to his appointment.

## 2018-10-24 NOTE — Telephone Encounter (Signed)
Left message and letter was sent  Douglas Barton

## 2018-11-21 ENCOUNTER — Encounter: Payer: Self-pay | Admitting: Urology

## 2019-07-29 IMAGING — CT CT RENAL STONE PROTOCOL
2 of 4 series · 15 of 46 positions shown, 17 images · non-contrast
Comparison: 09/28/2016

CLINICAL DATA: Left flank pain today, right flank pain yesterday.

EXAM:
CT ABDOMEN AND PELVIS WITHOUT CONTRAST
TECHNIQUE: Multidetector CT imaging of the abdomen and pelvis was performed
following the standard protocol without IV contrast.

[Series 2: stone full standard · axial · 0.70mm/px · z∈[-478,-38]mm · 12 of 101 slices shown, 14 images]
[im 9/101  soft-tissue]
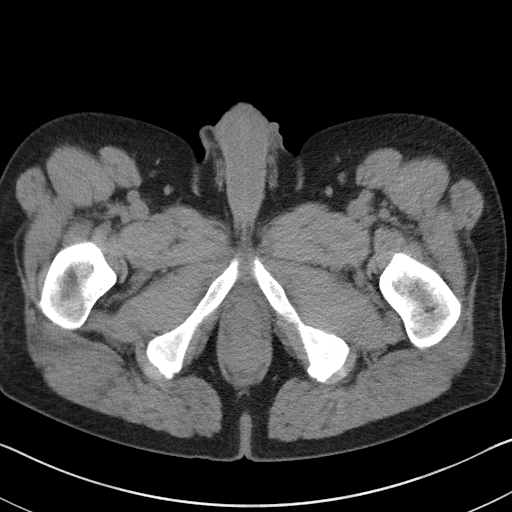
[im 9/101  bone]
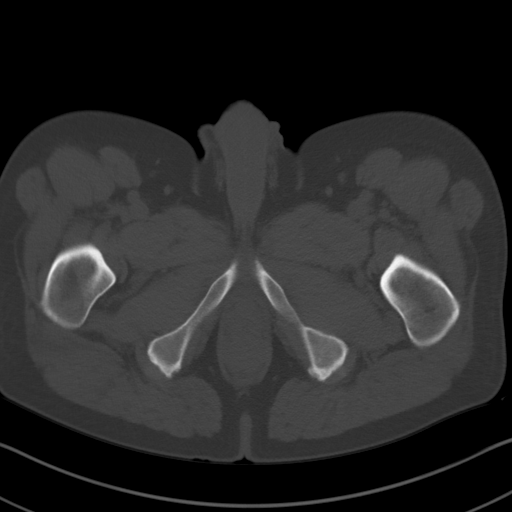
[im 17/101  soft-tissue]
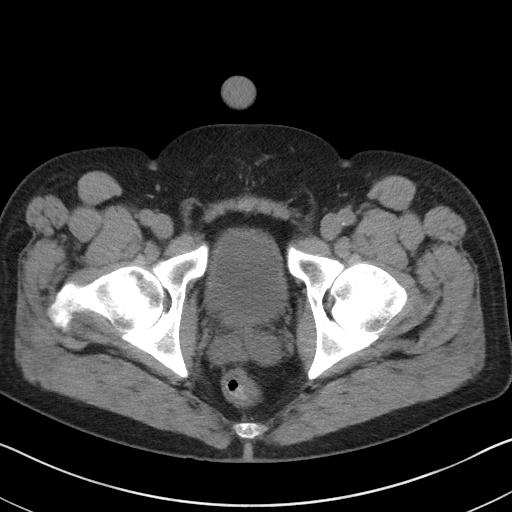
[im 25/101  soft-tissue]
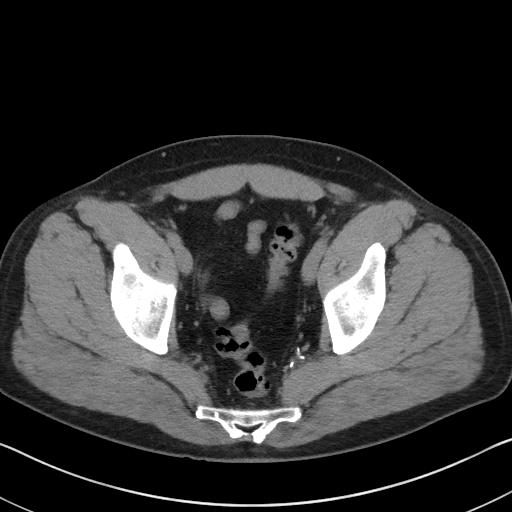
[im 33/101  soft-tissue]
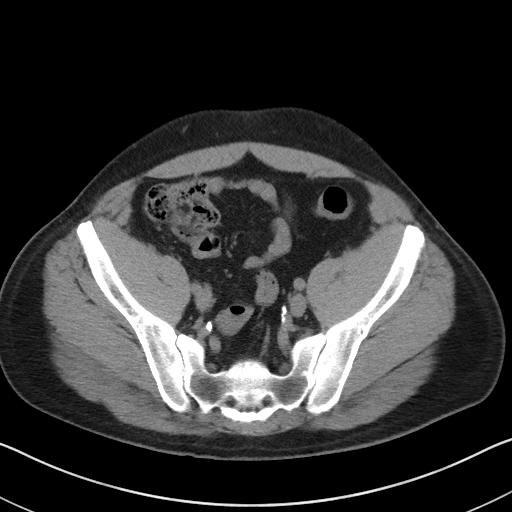
[im 41/101  soft-tissue]
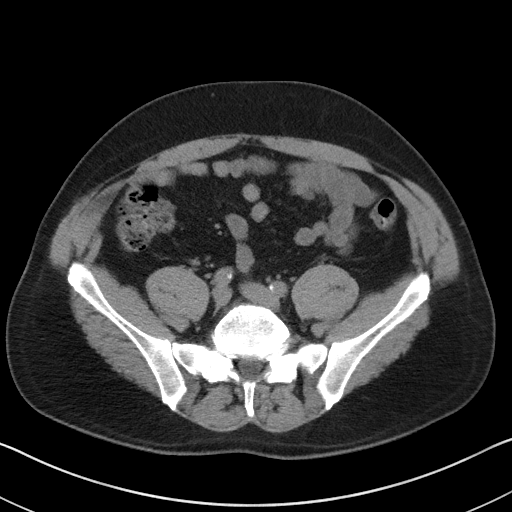
[im 49/101  soft-tissue]
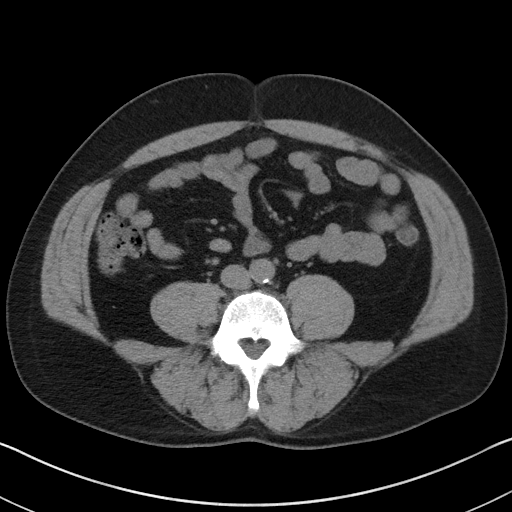
[im 57/101  soft-tissue]
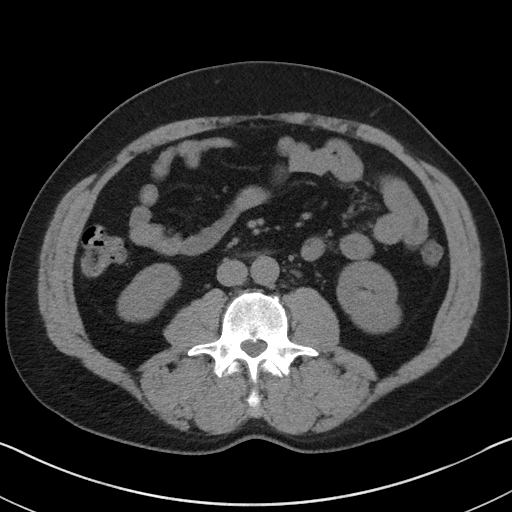
[im 65/101  soft-tissue]
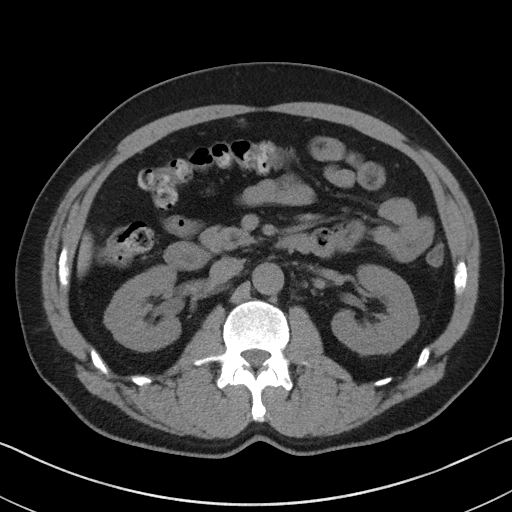
[im 73/101  soft-tissue]
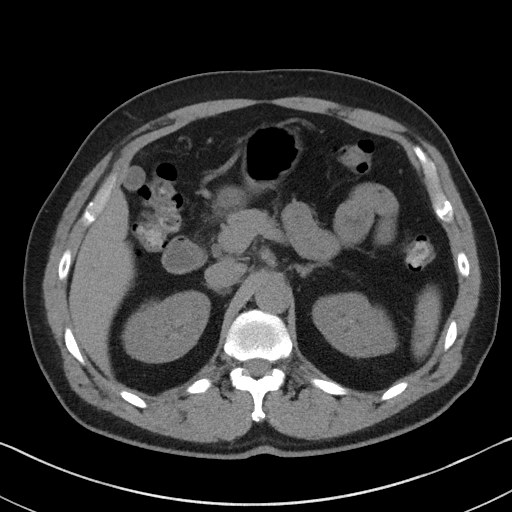
[im 73/101  bone]
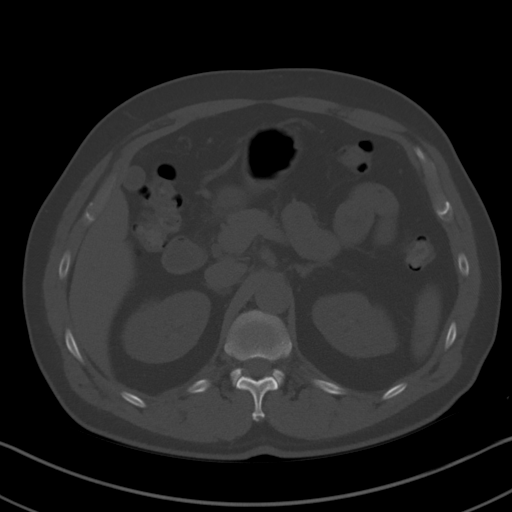
[im 81/101  soft-tissue]
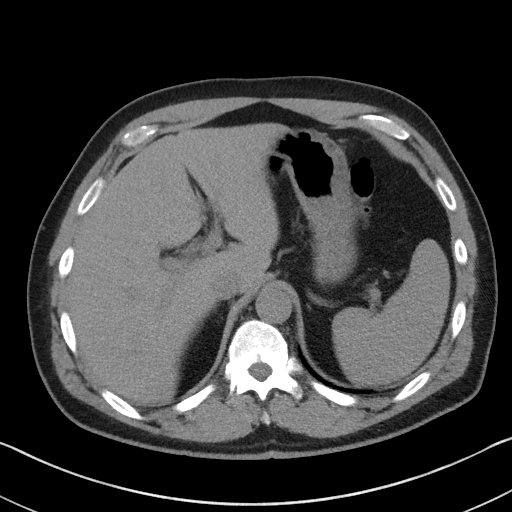
[im 89/101  soft-tissue]
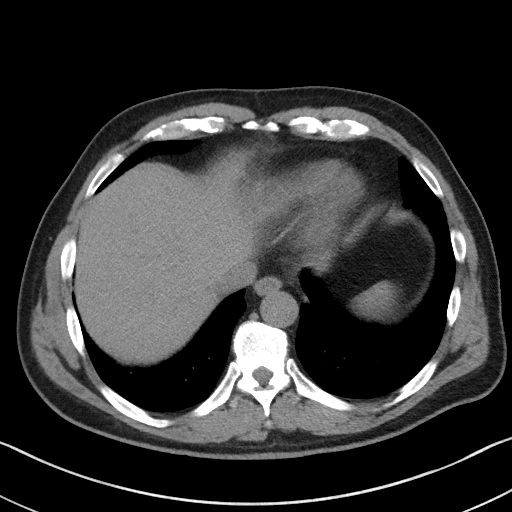
[im 97/101  soft-tissue]
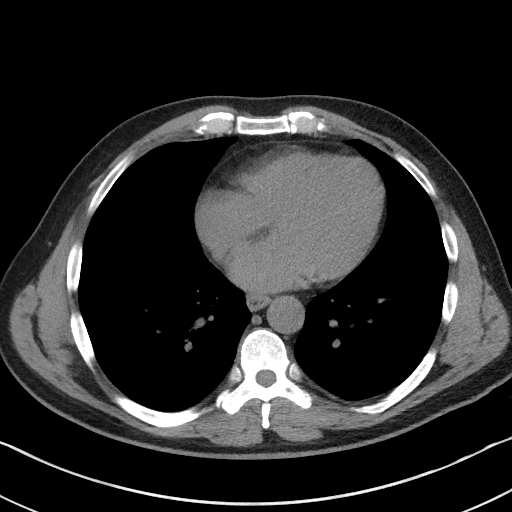

[Series 5: coronal · coronal · 0.76mm/px · 3 of 143 slices shown]
[im 48/143  soft-tissue]
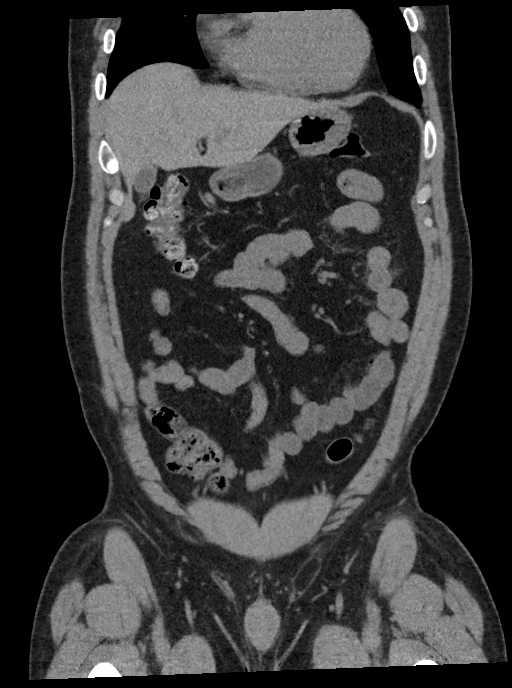
[im 64/143  soft-tissue]
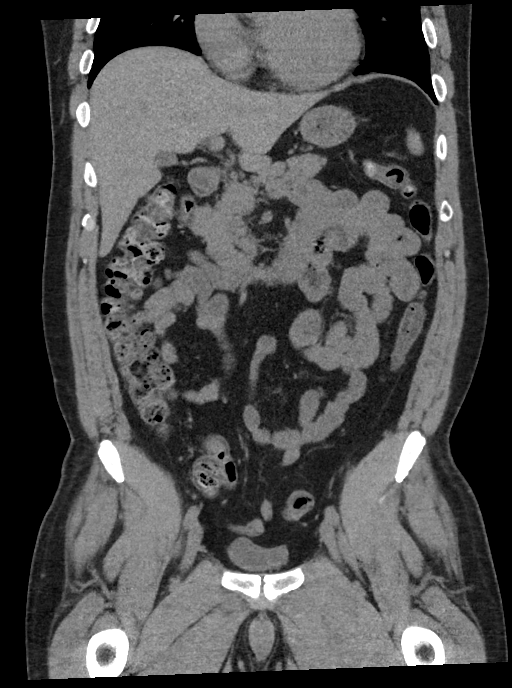
[im 79/143  soft-tissue]
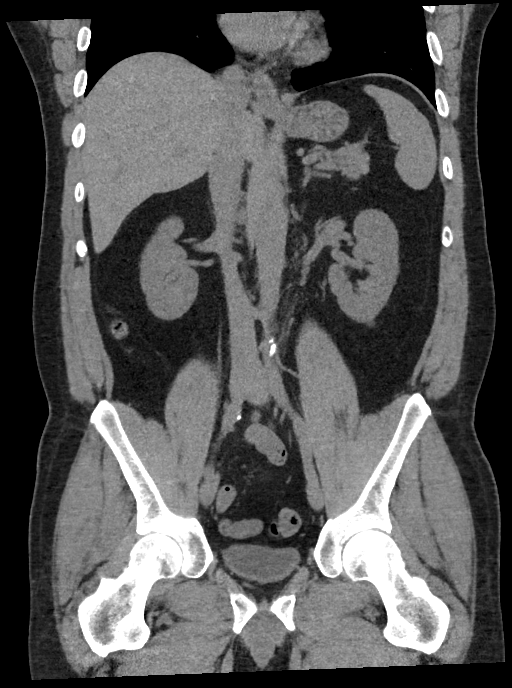

[15 of 46 positions shown; findings below may reference images not displayed]

FINDINGS: Lower chest: Borderline cardiomegaly.

Hepatobiliary: Mildly contracted gallbladder. Upper normal caliber
of the extrahepatic biliary tree.

Pancreas: Unremarkable

Spleen: Unremarkable

Adrenals/Urinary Tract: The adrenal glands appear normal.

2 mm right kidney upper pole nonobstructive renal calculus. Vascular
calcification in the left renal hilum. No hydronephrosis,
hydroureter, or ureteral calculus identified. 9 mm exophytic lesion
from the left kidney lower pole previously measured 7 mm and is
statistically likely to be a cyst. There is also a small hypodense
lesion of the left mid kidney posteriorly at the site of a prior
cyst.

Stomach/Bowel: Along the upper margin of the stomach there is an 8
mm in diameter soft tissue density which is stable from 08/31/2016
but mildly larger than on 09/13/2010. This could represent a small
diverticulum or a small lymph node/soft tissue lesion along the
upper gastric serosa. Appendix surgically absent.

Vascular/Lymphatic: Aortoiliac atherosclerotic vascular disease. No
pathologic adenopathy.

Reproductive: Unremarkable

Other: No supplemental non-categorized findings.

Musculoskeletal: Lower lumbar spondylosis and degenerative disc
disease causing foraminal impingement bilaterally at L4-5 and L5-S1.
IMPRESSION: 1. Nonobstructive 2 mm right kidney upper pole renal calculus. No
other discrete urinary tract calculi are identified. The small
calcification along the left renal hilum is thought to be vascular.
2. Bilateral foraminal impingement at L4-5 and L5-S1 due to
spondylosis and degenerative disc disease.
3. There is a 8 mm nodular density along the upper serosal margin of
Possibilities include a small gastric diverticulum, a small lymph
node, or a small mass along the gastric serosa.
4. Borderline cardiomegaly.

These results will be called to the ordering clinician or
representative by the Radiologist Assistant, and communication
documented in the PACS or zVision Dashboard.

## 2020-03-17 DIAGNOSIS — E118 Type 2 diabetes mellitus with unspecified complications: Secondary | ICD-10-CM | POA: Insufficient documentation

## 2021-05-02 ENCOUNTER — Other Ambulatory Visit: Payer: Self-pay

## 2021-05-02 ENCOUNTER — Observation Stay
Admission: EM | Admit: 2021-05-02 | Discharge: 2021-05-03 | Disposition: A | Discharge status: Home / Self Care | Payer: BC Managed Care – PPO | Attending: Internal Medicine | Admitting: Internal Medicine

## 2021-05-02 ENCOUNTER — Emergency Department: Payer: BC Managed Care – PPO

## 2021-05-02 DIAGNOSIS — F17210 Nicotine dependence, cigarettes, uncomplicated: Secondary | ICD-10-CM | POA: Insufficient documentation

## 2021-05-02 DIAGNOSIS — Z720 Tobacco use: Secondary | ICD-10-CM | POA: Diagnosis present

## 2021-05-02 DIAGNOSIS — Z20822 Contact with and (suspected) exposure to covid-19: Secondary | ICD-10-CM | POA: Diagnosis not present

## 2021-05-02 DIAGNOSIS — R0789 Other chest pain: Secondary | ICD-10-CM | POA: Diagnosis not present

## 2021-05-02 DIAGNOSIS — R071 Chest pain on breathing: Secondary | ICD-10-CM

## 2021-05-02 DIAGNOSIS — F1110 Opioid abuse, uncomplicated: Secondary | ICD-10-CM | POA: Diagnosis present

## 2021-05-02 DIAGNOSIS — K219 Gastro-esophageal reflux disease without esophagitis: Secondary | ICD-10-CM

## 2021-05-02 DIAGNOSIS — I10 Essential (primary) hypertension: Secondary | ICD-10-CM | POA: Insufficient documentation

## 2021-05-02 DIAGNOSIS — Z79899 Other long term (current) drug therapy: Secondary | ICD-10-CM | POA: Diagnosis not present

## 2021-05-02 DIAGNOSIS — K648 Other hemorrhoids: Secondary | ICD-10-CM | POA: Diagnosis present

## 2021-05-02 DIAGNOSIS — R9431 Abnormal electrocardiogram [ECG] [EKG]: Secondary | ICD-10-CM | POA: Diagnosis not present

## 2021-05-02 DIAGNOSIS — R079 Chest pain, unspecified: Secondary | ICD-10-CM

## 2021-05-02 LAB — CBC
HCT: 48.8 % (ref 39.0–52.0)
Hemoglobin: 16.7 g/dL (ref 13.0–17.0)
MCH: 31.9 pg (ref 26.0–34.0)
MCHC: 34.2 g/dL (ref 30.0–36.0)
MCV: 93.1 fL (ref 80.0–100.0)
Platelets: 259 10*3/uL (ref 150–400)
RBC: 5.24 MIL/uL (ref 4.22–5.81)
RDW: 12.6 % (ref 11.5–15.5)
WBC: 8.5 10*3/uL (ref 4.0–10.5)
nRBC: 0 % (ref 0.0–0.2)

## 2021-05-02 LAB — LIPASE, BLOOD: Lipase: 37 U/L (ref 11–51)

## 2021-05-02 LAB — HIV ANTIBODY (ROUTINE TESTING W REFLEX): HIV Screen 4th Generation wRfx: NONREACTIVE

## 2021-05-02 LAB — RESP PANEL BY RT-PCR (FLU A&B, COVID) ARPGX2
Influenza A by PCR: NEGATIVE
Influenza B by PCR: NEGATIVE
SARS Coronavirus 2 by RT PCR: NEGATIVE

## 2021-05-02 LAB — HEPATIC FUNCTION PANEL
ALT: 22 U/L (ref 0–44)
AST: 20 U/L (ref 15–41)
Albumin: 4 g/dL (ref 3.5–5.0)
Alkaline Phosphatase: 87 U/L (ref 38–126)
Bilirubin, Direct: <0.1 mg/dL (ref 0.0–0.2)
Total Bilirubin: 0.4 mg/dL (ref 0.3–1.2)
Total Protein: 7 g/dL (ref 6.5–8.1)

## 2021-05-02 LAB — BASIC METABOLIC PANEL
Anion gap: 9 (ref 5–15)
BUN: 13 mg/dL (ref 6–20)
CO2: 24 mmol/L (ref 22–32)
Calcium: 9.3 mg/dL (ref 8.9–10.3)
Chloride: 106 mmol/L (ref 98–111)
Creatinine, Ser: 0.76 mg/dL (ref 0.61–1.24)
GFR, Estimated: >60 mL/min (ref >60)
Glucose, Bld: 101 mg/dL — ABNORMAL HIGH (ref 70–99)
Potassium: 3.9 mmol/L (ref 3.5–5.1)
Sodium: 139 mmol/L (ref 135–145)

## 2021-05-02 LAB — TSH: TSH: 1.476 u[IU]/mL (ref 0.350–4.500)

## 2021-05-02 LAB — TROPONIN I (HIGH SENSITIVITY)
Troponin I (High Sensitivity): 43 ng/L — ABNORMAL HIGH (ref <18)
Troponin I (High Sensitivity): 42 ng/L — ABNORMAL HIGH (ref <18)

## 2021-05-02 LAB — VITAMIN B12: Vitamin B-12: 359 pg/mL (ref 180–914)

## 2021-05-02 LAB — D-DIMER, QUANTITATIVE: D-Dimer, Quant: 0.3 ug/mL-FEU (ref 0.00–0.50)

## 2021-05-02 MED ORDER — ENOXAPARIN SODIUM 40 MG/0.4ML IJ SOSY
40.0000 mg | PREFILLED_SYRINGE | INTRAMUSCULAR | Status: DC
  Administered 2021-05-02: 40 mg via SUBCUTANEOUS
  Filled 2021-05-02: qty 0.4

## 2021-05-02 MED ORDER — AMLODIPINE BESYLATE 5 MG PO TABS
5.0000 mg | ORAL_TABLET | Freq: Every day | ORAL | Status: DC
  Administered 2021-05-03: 5 mg via ORAL
  Filled 2021-05-02: qty 1

## 2021-05-02 MED ORDER — ACETAMINOPHEN 500 MG PO TABS
1000.0000 mg | ORAL_TABLET | Freq: Once | ORAL | Status: AC
  Administered 2021-05-02: 1000 mg via ORAL
  Filled 2021-05-02: qty 2

## 2021-05-02 MED ORDER — ONDANSETRON HCL 4 MG/2ML IJ SOLN: 4.0000 mg | Freq: Four times a day (QID) | INTRAMUSCULAR | Status: DC | PRN

## 2021-05-02 MED ORDER — ASPIRIN EC 81 MG PO TBEC
81.0000 mg | DELAYED_RELEASE_TABLET | Freq: Every day | ORAL | Status: DC
  Administered 2021-05-03: 81 mg via ORAL
  Filled 2021-05-02: qty 1

## 2021-05-02 MED ORDER — ASPIRIN 81 MG PO CHEW
324.0000 mg | CHEWABLE_TABLET | Freq: Once | ORAL | Status: AC
  Administered 2021-05-02: 324 mg via ORAL
  Filled 2021-05-02: qty 4

## 2021-05-02 MED ORDER — PROPRANOLOL HCL 20 MG PO TABS: 10.0000 mg | ORAL_TABLET | Freq: Every day | ORAL | Status: DC | PRN

## 2021-05-02 MED ORDER — NICOTINE 21 MG/24HR TD PT24
21.0000 mg | MEDICATED_PATCH | Freq: Every day | TRANSDERMAL | Status: DC | PRN
Start: 2021-05-02 — End: 2021-05-03

## 2021-05-02 MED ORDER — ALBUTEROL SULFATE (2.5 MG/3ML) 0.083% IN NEBU: 2.5000 mg | INHALATION_SOLUTION | RESPIRATORY_TRACT | Status: DC | PRN

## 2021-05-02 MED ORDER — PANTOPRAZOLE SODIUM 40 MG PO TBEC
40.0000 mg | DELAYED_RELEASE_TABLET | Freq: Every day | ORAL | Status: DC
  Administered 2021-05-03: 40 mg via ORAL
  Filled 2021-05-02: qty 1

## 2021-05-02 MED ORDER — ACETAMINOPHEN 325 MG PO TABS
650.0000 mg | ORAL_TABLET | ORAL | Status: DC | PRN
  Administered 2021-05-02: 650 mg via ORAL
  Filled 2021-05-02: qty 2

## 2021-05-02 MED ORDER — LIDOCAINE 5 % EX PTCH
1.0000 | MEDICATED_PATCH | CUTANEOUS | Status: DC
  Administered 2021-05-02: 1 via TRANSDERMAL
  Filled 2021-05-02 (×2): qty 1

## 2021-05-02 MED ORDER — ALBUTEROL SULFATE HFA 108 (90 BASE) MCG/ACT IN AERS: 2.0000 | INHALATION_SPRAY | RESPIRATORY_TRACT | Status: DC | PRN

## 2021-05-02 MED ORDER — AMPHETAMINE-DEXTROAMPHETAMINE 10 MG PO TABS
10.0000 mg | ORAL_TABLET | Freq: Every day | ORAL | Status: DC
  Filled 2021-05-02: qty 1

## 2021-05-02 NOTE — ED Provider Notes (Signed)
[facility] Emergency Department Provider Note  ____________________________________________   Event Date/Time   First MD Initiated Contact with Patient 05/02/21 1211     (approximate)  I have reviewed the triage vital signs and the nursing notes.   HISTORY  Chief Complaint Chest Pain    HPI Douglas Barton is a 50 y.o. male with hypertension who comes in with left chest pain.  Patient reports having some chest pain previously he was diagnosed with acid reflux.  He states that at that time he had a normal EKG.  He states that he has been taking the PPI   and those symptoms have gotten better.  However he states that yesterday he had an aching in her shoulder that felt different.  States it Felt like he pulled a muscle but does not report pulling a muscle and states that the pain was even there without moving the shoulder.  He then states that he developed pain in his chest after eating.  This pain was constant, nothing made it better.  Has not taken any elevating medications.  Currently the pain is mild and is a 4 out of 10.  He does report some associated shortness of breath and pain with taking a deep breath.  Denies feeling sick previously.  He does have a smoking history.          Past Medical History:  Diagnosis Date   Hypertension    Kidney stone     Patient Active Problem List   Diagnosis Date Noted   Internal hemorrhoids 09/06/2016   Tobacco use 09/06/2016   Chronic migraine without aura 04/07/2015   Essential hypertension with goal blood pressure less than 140/90 04/07/2015   Narcotic abuse (HCC) 04/07/2015    Past Surgical History:  Procedure Laterality Date   APPENDECTOMY      Prior to Admission medications   Medication Sig Start Date End Date Taking? Authorizing Provider  albuterol (PROVENTIL HFA;VENTOLIN HFA) 108 (90 BASE) MCG/ACT inhaler Inhale 4-6 puffs by mouth every 4 hours as needed for wheezing, cough, and/or shortness of  breath Patient not taking: Reported on 10/03/2016 03/25/15   Loleta Rose, MD  amLODipine (NORVASC) 5 MG tablet Take by mouth. 09/11/17   [provider]  naproxen (NAPROSYN) 500 MG tablet Take 1 tablet (500 mg total) by mouth 2 (two) times daily with a meal. Patient not taking: Reported on 10/03/2016 05/04/16   Hagler, Jami L, PA-C  ondansetron (ZOFRAN) 4 MG tablet Take 1 tablet (4 mg total) by mouth daily as needed. Patient not taking: Reported on 09/06/2016 06/29/16   Myrna Blazer, MD  predniSONE (DELTASONE) 10 MG tablet 6 tabs x 1 day, 5 tabs x 1 day, 4 tabs x 1 day, etc... 10/10/17   [provider]  propranolol (INDERAL) 20 MG tablet Take 1 tablet by mouth 2 (two) times daily. 06/15/16   [provider]    Allergies Tramadol  Family History  Problem Relation Age of Onset   Prostate cancer Neg Hx    Bladder Cancer Neg Hx    Kidney cancer Neg Hx     Social History Social History   Tobacco Use   Smoking status: Every Day    Packs/day: 2.00    Types: Cigarettes   Smokeless tobacco: Never  Substance Use Topics   Alcohol use: No   Drug use: No      Review of Systems Constitutional: No fever/chills Eyes: No visual changes. ENT: No sore  throat. Cardiovascular: Positive chest pain Respiratory: Positive shortness of breath Gastrointestinal: No abdominal pain.  No nausea, no vomiting.  No diarrhea.  No constipation. Genitourinary: Negative for dysuria. Musculoskeletal: Negative for back pain. Skin: Negative for rash. Neurological: Negative for headaches, focal weakness or numbness. All other ROS negative ____________________________________________   PHYSICAL EXAM:  VITAL SIGNS: ED Triage Vitals [05/02/21 0844]  Enc Vitals Group     BP 114/86     Pulse Rate 74     Resp 18     Temp 97.8 F (36.6 C)     Temp Source Oral     SpO2 99 %     Weight 166 lb (75.3 kg)     Height 5\' 8"  (1.727 m)     Head Circumference      Peak Flow       Pain Score 6     Pain Loc      Pain Edu?      Excl. in GC?     Constitutional: Alert and oriented. Well appearing and in no acute distress. Eyes: Conjunctivae are normal. EOMI. Head: Atraumatic. Nose: No congestion/rhinnorhea. Mouth/Throat: Mucous membranes are moist.   Neck: No stridor. Trachea Midline. FROM Cardiovascular: Normal rate, regular rhythm. Grossly normal heart sounds.  Good peripheral circulation.  No chest wall tenderness Respiratory: Normal respiratory effort.  No retractions. Lungs CTAB. Gastrointestinal: Soft and nontender. No distention. No abdominal bruits.  Musculoskeletal: No lower extremity tenderness nor edema.  No joint effusions. Neurologic:  Normal speech and language. No gross focal neurologic deficits are appreciated.  Skin:  Skin is warm, dry and intact. No rash noted. Psychiatric: Mood and affect are normal. Speech and behavior are normal. GU: Deferred   ____________________________________________   LABS (all labs ordered are listed, but only abnormal results are displayed)  Labs Reviewed  BASIC METABOLIC PANEL - Abnormal; Notable for the following components:      Result Value   Glucose, Bld 101 (*)    All other components within normal limits  TROPONIN I (HIGH SENSITIVITY) - Abnormal; Notable for the following components:   Troponin I (High Sensitivity) 43 (*)    All other components within normal limits  RESP PANEL BY RT-PCR (FLU A&B, COVID) ARPGX2  CBC  HEPATIC FUNCTION PANEL  LIPASE, BLOOD  D-DIMER, QUANTITATIVE  TROPONIN I (HIGH SENSITIVITY)   ____________________________________________   ED ECG REPORT I, , the attending physician, personally viewed and interpreted this ECG.  Normal sinus rate of 68, no ST elevation, T wave inversions in V2 V3 V4 and V5, normal intervals  Repeat EKG sinus rate of 78, no ST elevations, continue T wave inversions, normal  intervals ____________________________________________  RADIOLOGY Concha Se, personally viewed and evaluated these images (plain radiographs) as part of my medical decision making, as well as reviewing the written report by the radiologist.  ED MD interpretation:  no pna   Official radiology report(s): DG Chest 2 View  Result Date: 05/02/2021 CLINICAL DATA:  Chest pain. EXAM: CHEST - 2 VIEW COMPARISON:  March 25, 2015. FINDINGS: The heart size and mediastinal contours are within normal limits. Both lungs are clear. The visualized skeletal structures are unremarkable. IMPRESSION: No active cardiopulmonary disease. Electronically Signed   By: March 27, 2015 M.D.   On: 05/02/2021 09:24    ____________________________________________   PROCEDURES  Procedure(s) performed (including Critical Care):  .1-3 Lead EKG Interpretation  Date/Time: 05/02/2021 2:59 PM Performed by: 05/04/2021, MD Authorized by: Concha Se,  Alben Spittle, MD     Interpretation: normal     ECG rate:  70s   ECG rate assessment: normal     Rhythm: sinus rhythm     Ectopy: none     Conduction: normal     ____________________________________________   INITIAL IMPRESSION / ASSESSMENT AND PLAN / ED COURSE   Douglas Barton was evaluated in Emergency Department on 05/02/2021 for the symptoms described in the history of present illness. He was evaluated in the context of the global COVID-19 pandemic, which necessitated consideration that the patient might be at risk for infection with the SARS-CoV-2 virus that causes COVID-19. Institutional protocols and algorithms that pertain to the evaluation of patients at risk for COVID-19 are in a state of rapid change based on information released by regulatory bodies including the CDC and federal and state organizations. These policies and algorithms were followed during the patient's care in the ED.    Most Likely DDx:  -ACS.  Patient has new EKG changes with T wave inversions.   When I looked at his prior in our system 3 years ago he did not have these and unfortunately I cannot see the EKGs done a week ago at his PCPs but it states that it was normal.  We will give patient full dose aspirin, Tylenol, lidocaine patch to help with pain given the pain is only minimal at this time.  Given the patient does report some shortness of breath with pleuritic chest pain we will also get D-dimer to evaluate for PE.  Heart score is 4   DDx that was also considered d/t potential to cause harm, but was found less likely based on history and physical (as detailed above): -PNA (no fevers, cough but CXR to evaluate) -PNX (reassured with equal b/l breath sounds, CXR to evaluate) -Symptomatic anemia (will get H&H) -Aortic Dissection as no tearing pain and no radiation to the mid back, pulses equal -Pericarditis no rub on exam, EKG changes or hx to suggest dx -Tamponade (no notable SOB, tachycardic, hypotensive) -Esophageal rupture (no h/o diffuse vomitting/no crepitus)   Troponin is stable but ddimer is negative.  Given patient's new EKG changes I discussed with patient bring him into the hospital for cardiac work-up.  Patient expressed understanding felt comfortable with this plan         ____________________________________________   FINAL CLINICAL IMPRESSION(S) / ED DIAGNOSES   Final diagnoses:  Nonspecific chest pain  EKG abnormality     MEDICATIONS GIVEN DURING THIS VISIT:  Medications  aspirin chewable tablet 324 mg (has no administration in time range)  acetaminophen (TYLENOL) tablet 1,000 mg (has no administration in time range)  lidocaine (LIDODERM) 5 % 1 patch (has no administration in time range)     ED Discharge Orders     None        Note:  This document was prepared using Dragon voice recognition software and may include unintentional dictation errors.    Concha Se, MD 05/02/21 [phone]

## 2021-05-02 NOTE — ED Triage Notes (Signed)
Pt comes pov with chest pain. Happened two weeks ago and was treated for indigestion but last night pt states it happened after he started eating again. States it feels like a pulled muscle. Denies radiation.

## 2021-05-02 NOTE — ED Notes (Signed)
Assumed care of patient, denies CP at this time. VS cycled, given pillow & call light. Spouse at bedside. Pt denies needs at this time.

## 2021-05-02 NOTE — H&P (Signed)
History and Physical   Douglas Barton IFO:[id number] DOB: [birth year] DOA: 05/02/2021  PCP: Kirk Ruths, MD  Outpatient Specialists: Dr. Alice Reichert, gastroenterology Patient coming from: home   I have personally briefly reviewed patient's old medical records in [place].  Chief Concern: Chest pain  HPI: Douglas Barton is a 50 y.o. male with medical history significant for attention deficit disorder, hypertension, GERD, presents emergency department for chief concerns of chest pain.  He endorses chest pain, started last evening, sitting on couch, like a pulled muscle feeling and is consistent. He states the symptoms is improved with laying on his right side. He chest pain is worse with positioning and moving around, deep inhalation.   On 04/30/21, he endorsed having left shoulder pain, sharp, persistent. He denies trauma. He denies numbness down his arm.   He has never experienced these two symptoms before.  Patient did note that his best friend recently passed away in the last few days.  He does note that two weeks ago, he had epigastric indigestion.  And was prescribed indigestion medications with some improvement outpatient.  He also remembers that his PCP notes that his EKG was normal at that time.  He denies nausea, vomiting, trauma to his person, vision changes, headaches, falling, loc, syncope, abdominal, dysuria, hematuria, constipation, diarrhea, blood in his stool, appetite lost, or weight changes, weakness.   Social history: He lives with his girlfriend at home. He smokes 1.5 ppd, started when he was 50 years old. He denies etoh and recreational drug use. He works as a Therapist, occupational.   Vaccination history: He is not vaccinated for covid 19.   ROS: Constitutional: no weight change, no fever ENT/Mouth: no sore throat, no rhinorrhea Eyes: no eye pain, no vision changes Cardiovascular: + chest pain, no dyspnea,  no edema, no palpitations Respiratory: no cough, no  sputum, no wheezing Gastrointestinal: no nausea, no vomiting, no diarrhea, no constipation Genitourinary: no urinary incontinence, no dysuria, no hematuria Musculoskeletal: no arthralgias, no myalgias.  Left shoulder pain. Skin: no skin lesions, no pruritus, Neuro: + weakness, no loss of consciousness, no syncope Psych: no anxiety, no depression, + decrease appetite Heme/Lymph: no bruising, no bleeding  ED Course: Discussed with emergency medicine provider, patient requiring hospitalization for chief concerns of chest pain.  Vitals in the emergency department was remarkable for temperature of 97.8, respiration rate of 18, heart rate 74, blood pressure 114/86, SPO2 of 99% on room air.  Serum sodium was 139, potassium 3.9, chloride 106, bicarb 24, BUN 13, serum creatinine of 0.76, EGFR greater than 60, nonfasting blood glucose 101, WBC 8.5, hemoglobin 16.7, platelets 259.  COVID was negative.  D-dimer was 0.30.  Troponin high-sensitivity was 43 down to 42.  In the emergency department patient was given Tylenol 1000 mg, aspirin 324 mg, lidocaine patch.  Assessment/Plan  Active Problems:   Essential hypertension with goal blood pressure less than 140/90   Internal hemorrhoids   Narcotic abuse ([place])   Tobacco use   Chest pain   # Chest pain-etiology work-up in progress however with symptoms on inhalation, and inverted T wave # Mild troponin elevation that is downtrending # Inverted T wave compared to EKG in 2017 - Admit to MedSurg, observation, telemetry - Complete echo ordered, lipid panel fasting - If chest pain reoccurs, would recommend a.m. team to consult cardiology and or discharge patient with cardiology outpatient evaluation for stress test  # History of hypertension-controlled, resumed amlodipine 5 mg daily  # ADHD-Adderall  resumed  # GERD-PPI  # Tobacco dependence-nicotine patch as needed for nicotine craving ordered, 2 days  Chart reviewed.   DVT prophylaxis:  Enoxaparin Code Status: Full code Diet: Heart healthy Family Communication: No Disposition Plan: Pending clinical course Consults called: None at this time Admission status: MedSurg, observation, telemetry  Past Medical History:  Diagnosis Date   Hypertension    Kidney stone    Past Surgical History:  Procedure Laterality Date   APPENDECTOMY     Social History:  reports that he has been smoking cigarettes. He has been smoking an average of 2 packs per day. He has never used smokeless tobacco. He reports that he does not drink alcohol and does not use drugs.  Allergies  Allergen Reactions   Tramadol Nausea Only   Family History  Problem Relation Age of Onset   Prostate cancer Neg Hx    Bladder Cancer Neg Hx    Kidney cancer Neg Hx    Family history: Family history reviewed and not pertinent  Prior to Admission medications   Medication Sig Start Date End Date Taking? Authorizing Provider  amLODipine (NORVASC) 5 MG tablet Take by mouth. 09/11/17   [provider]  amphetamine-dextroamphetamine (ADDERALL) 10 MG tablet Take by mouth. 04/30/21   [provider]  gabapentin (NEURONTIN) 100 MG capsule Take 100 mg by mouth 3 (three) times daily. 04/05/21   [provider]  pantoprazole (PROTONIX) 40 MG tablet Take 40 mg by mouth daily. 04/19/21   [provider]   Physical Exam: Vitals:   05/02/21 0844 05/02/21 1307 05/02/21 1400  BP: 114/86 111/80 115/78  Pulse: 74 70 79  Resp: _0 Temp: 97.8 F (36.6 C)    TempSrc: Oral    SpO2: 99% 98% 98%  Weight: 75.3 kg    Height: 5' 8" (1.727 m)     Constitutional: appears age-appropriate, NAD, calm, comfortable Eyes: PERRL, lids and conjunctivae normal ENMT: Mucous membranes are moist. Posterior pharynx clear of any exudate or lesions. Age-appropriate dentition. Hearing appropriate Neck: normal, supple, no masses, no thyromegaly Respiratory: clear to auscultation bilaterally, no wheezing, no  crackles. Normal respiratory effort. No accessory muscle use.  Cardiovascular: Regular rate and rhythm, no murmurs / rubs / gallops. No extremity edema. 2+ pedal pulses. No carotid bruits.  Abdomen: no tenderness, no masses palpated, no hepatosplenomegaly. Bowel sounds positive.  Musculoskeletal: no clubbing / cyanosis. No joint deformity upper and lower extremities. Good ROM, no contractures, no atrophy. Normal muscle tone.  Skin: no rashes, lesions, ulcers. No induration Neurologic: Sensation intact. Strength 5/5 in all 4.  Psychiatric: Normal judgment and insight. Alert and oriented x 3. Normal mood.   EKG: independently reviewed, showing sinus rhythm with rate of 78, QTc 455, T wave inversions in lateral leads  Chest x-ray on Admission: I personally reviewed and I agree with radiologist reading as below.  DG Chest 2 View  Result Date: 05/02/2021 CLINICAL DATA:  Chest pain. EXAM: CHEST - 2 VIEW COMPARISON:  March 25, 2015. FINDINGS: The heart size and mediastinal contours are within normal limits. Both lungs are clear. The visualized skeletal structures are unremarkable. IMPRESSION: No active cardiopulmonary disease. Electronically Signed   By: Marijo Conception M.D.   On: 05/02/2021 09:24    Labs on Admission: I have personally reviewed following labs  CBC: Recent Labs  Lab 05/02/21 0846  WBC 8.5  HGB 16.7  HCT 48.8  MCV 93.1  PLT 546   Basic Metabolic Panel:  Recent Labs  Lab 05/02/21 0846  NA 139  K 3.9  CL 106  CO2 24  GLUCOSE 101*  BUN 13  CREATININE 0.76  CALCIUM 9.3   GFR: Estimated Creatinine Clearance: 105.7 mL/min (by C-G formula based on SCr of 0.76 mg/dL).  Liver Function Tests: Recent Labs  Lab 05/02/21 0846  AST 20  ALT 22  ALKPHOS 87  BILITOT 0.4  PROT 7.0  ALBUMIN 4.0   Recent Labs  Lab 05/02/21 0846  LIPASE 37   Urine analysis:    Component Value Date/Time   COLORURINE STRAW (A) 08/31/2016 1807   APPEARANCEUR Clear 11/14/2017 1126    LABSPEC 1.006 08/31/2016 1807   LABSPEC 1.016 12/23/2013 0157   PHURINE 7.0 08/31/2016 1807   GLUCOSEU Negative 11/14/2017 1126   GLUCOSEU Negative 12/23/2013 0157   HGBUR MODERATE (A) 08/31/2016 1807   BILIRUBINUR Negative 11/14/2017 1126   BILIRUBINUR Negative 12/23/2013 0157   KETONESUR NEGATIVE 08/31/2016 1807   PROTEINUR Negative 11/14/2017 1126   PROTEINUR NEGATIVE 08/31/2016 1807   UROBILINOGEN 0.2 09/13/2010 1908   NITRITE Negative 11/14/2017 1126   NITRITE NEGATIVE 08/31/2016 1807   LEUKOCYTESUR Negative 11/14/2017 1126   LEUKOCYTESUR Negative 12/23/2013 0157   Dr. Tobie Poet [facility]  If 7PM-7AM, please contact overnight-coverage provider If 7AM-7PM, please contact day coverage provider [email or web address]  05/02/2021, 2:54 PM

## 2021-05-02 NOTE — ED Notes (Signed)
Pt given warm blanket.

## 2021-05-02 NOTE — ED Notes (Signed)
Report given to INPT RN.

## 2021-05-03 ENCOUNTER — Observation Stay (HOSPITAL_BASED_OUTPATIENT_CLINIC_OR_DEPARTMENT_OTHER)
Admit: 2021-05-03 | Discharge: 2021-05-03 | Disposition: A | Discharge status: Home / Self Care | Payer: BC Managed Care – PPO | Attending: Internal Medicine | Admitting: Internal Medicine

## 2021-05-03 DIAGNOSIS — R071 Chest pain on breathing: Secondary | ICD-10-CM | POA: Diagnosis not present

## 2021-05-03 DIAGNOSIS — R079 Chest pain, unspecified: Secondary | ICD-10-CM

## 2021-05-03 LAB — ECHOCARDIOGRAM COMPLETE
AR max vel: 3.78 cm2
AV Area VTI: 3.74 cm2
AV Area mean vel: 3.74 cm2
AV Mean grad: 4 mmHg
AV Peak grad: 7.1 mmHg
Ao pk vel: 1.33 m/s
Area-P 1/2: 3.43 cm2
Height: 68 in
MV VTI: 3.59 cm2
S' Lateral: 4 cm
Weight: 2656 oz

## 2021-05-03 LAB — LIPID PANEL
Cholesterol: 177 mg/dL (ref 0–200)
HDL: 38 mg/dL — ABNORMAL LOW (ref >40)
LDL Cholesterol: 113 mg/dL — ABNORMAL HIGH (ref 0–99)
Total CHOL/HDL Ratio: 4.7 RATIO
Triglycerides: 128 mg/dL (ref <150)
VLDL: 26 mg/dL (ref 0–40)

## 2021-05-03 MED ORDER — ATORVASTATIN CALCIUM 40 MG PO TABS: 40.0000 mg | ORAL_TABLET | Freq: Every day | ORAL | 11 refills | Status: AC

## 2021-05-03 MED ORDER — ASPIRIN 81 MG PO TBEC: 81.0000 mg | DELAYED_RELEASE_TABLET | Freq: Every day | ORAL | 11 refills | Status: AC

## 2021-05-03 NOTE — Progress Notes (Signed)
*  PRELIMINARY RESULTS* Echocardiogram 2D Echocardiogram has been performed.  Douglas Barton Paislyn Domenico 05/03/2021, 9:42 AM

## 2021-05-03 NOTE — Plan of Care (Signed)

## 2021-05-03 NOTE — Progress Notes (Signed)
Pt discharged to home.  IV removed without complication.  AVS given to pt and explained with no further questions.  All belongings at bedside taken with pt. Pt ambulated off unit on his own.

## 2021-05-03 NOTE — Discharge Summary (Signed)
Physician Discharge Summary  Douglas Barton HKV:[id number] DOB: 12-04-1970 DOA: 05/02/2021  PCP: Lauro Regulus, MD  Admit date: 05/02/2021 Discharge date: 05/03/2021  Admitted From: Home Disposition: Home  Recommendations for Outpatient Follow-up:  Follow up with PCP in 1-2 weeks Please obtain BMP/CBC in one week  Discharge Condition: Stable CODE STATUS: Full Diet recommendation: Low-salt low-fat diet  Brief/Interim Summary: Douglas Barton is a 50 y.o. male with medical history significant for attention deficit disorder, hypertension, GERD, presents emergency department for chief concerns of chest pain.  Atypical chest pain, likely musculoskeletal/pleuritic ACS ruled out -Repeat EKG without overt ST elevations, troponin negative -Echocardiogram unremarkable -Lipid panel somewhat elevated, initiate statin, aspirin per protocol -Follow-up outpatient with PCP, no current indication for cardiac follow-up at this time  Hypertension, well controlled by amlodipine  ADHD-Adderall resumed   GERD-PPI   Tobacco dependence-lengthy discussion at bedside about smoking cessation, risks of ongoing abuse both short-term and long-term.  Nicotine replacement prescribed at discharge  Discharge Diagnoses:  Active Problems:   Essential hypertension with goal blood pressure less than 140/90   Internal hemorrhoids   Narcotic abuse (HCC)   Tobacco use   Chest pain    Discharge Instructions  Discharge Instructions     Diet - low sodium heart healthy   Complete by: As directed    Increase activity slowly   Complete by: As directed       Allergies as of 05/03/2021       Reactions   Tramadol Nausea Only        Medication List     TAKE these medications    amLODipine 5 MG tablet Commonly known as: NORVASC Take 5 mg by mouth daily.   amphetamine-dextroamphetamine 10 MG tablet Commonly known as: ADDERALL Take 10 mg by mouth daily with breakfast.   aspirin 81 MG EC  tablet Take 1 tablet (81 mg total) by mouth daily. Swallow whole. Start taking on: May 04, 2021   atorvastatin 40 MG tablet Commonly known as: Lipitor Take 1 tablet (40 mg total) by mouth daily.        Allergies  Allergen Reactions   Tramadol Nausea Only    Consultations: None   Procedures/Studies: DG Chest 2 View  Result Date: 05/02/2021 CLINICAL DATA:  Chest pain. EXAM: CHEST - 2 VIEW COMPARISON:  March 25, 2015. FINDINGS: The heart size and mediastinal contours are within normal limits. Both lungs are clear. The visualized skeletal structures are unremarkable. IMPRESSION: No active cardiopulmonary disease. Electronically Signed   By: Lupita Raider M.D.   On: 05/02/2021 09:24   ECHOCARDIOGRAM COMPLETE  Result Date: 05/03/2021    ECHOCARDIOGRAM REPORT   Patient Name:   Douglas Barton Date of Exam: 05/03/2021 Medical Rec #:  [id number]    Height:       68.0 in Accession #:    [id number]   Weight:       166.0 lb Date of Birth:  [birth year]    BSA:          1.888 m Patient Age:    50 years     BP:           107/79 mmHg Patient Gender: M            HR:           58 bpm. Exam Location:  [facility] Procedure: 2D Echo, Color Doppler, Cardiac Doppler and Strain Analysis Indications:     R07.9 Chest Pain; I21.4 NSTEMI  History:         Patient has no prior history of Echocardiogram examinations.                  Signs/Symptoms:Left shoulder pain; Risk Factors:Hypertension                  and Family History of Coronary Artery Disease.  Sonographer:     Humphrey RollsJoan Heiss Referring Phys:  [id number] AMY N COX Diagnosing Phys: Lorine BearsMuhammad Arida MD  Sonographer Comments: Suboptimal parasternal window. Global longitudinal strain was attempted. IMPRESSIONS  1. Left ventricular ejection fraction, by estimation, is 45 to 50%. The left ventricle has mildly decreased function. The left ventricle demonstrates regional wall motion abnormalities (see scoring diagram/findings for description). The left ventricular  internal  cavity size was mildly dilated. Left ventricular diastolic parameters were normal. There is severe hypokinesis of the left ventricular, apical anterior wall, inferior wall and apical segment.  2. Right ventricular systolic function is normal. The right ventricular size is normal.  3. The mitral valve is normal in structure. No evidence of mitral valve regurgitation. No evidence of mitral stenosis.  4. The aortic valve is normal in structure. Aortic valve regurgitation is not visualized. No aortic stenosis is present.  5. The inferior vena cava is normal in size with greater than 50% respiratory variability, suggesting right atrial pressure of 3 mmHg. FINDINGS  Left Ventricle: Left ventricular ejection fraction, by estimation, is 45 to 50%. The left ventricle has mildly decreased function. The left ventricle demonstrates regional wall motion abnormalities. Severe hypokinesis of the left ventricular, apical anterior wall, inferior wall and apical segment. The left ventricular internal cavity size was mildly dilated. There is no left ventricular hypertrophy. Left ventricular diastolic parameters were normal. Right Ventricle: The right ventricular size is normal. No increase in right ventricular wall thickness. Right ventricular systolic function is normal. Left Atrium: Left atrial size was normal in size. Right Atrium: Right atrial size was normal in size. Pericardium: There is no evidence of pericardial effusion. Mitral Valve: The mitral valve is normal in structure. No evidence of mitral valve regurgitation. No evidence of mitral valve stenosis. MV peak gradient, 3.2 mmHg. The mean mitral valve gradient is 1.0 mmHg. Tricuspid Valve: The tricuspid valve is normal in structure. Tricuspid valve regurgitation is not demonstrated. No evidence of tricuspid stenosis. Aortic Valve: The aortic valve is normal in structure. Aortic valve regurgitation is not visualized. No aortic stenosis is present. Aortic valve mean gradient  measures 4.0 mmHg. Aortic valve peak gradient measures 7.1 mmHg. Aortic valve area, by VTI measures 3.74 cm. Pulmonic Valve: The pulmonic valve was normal in structure. Pulmonic valve regurgitation is not visualized. No evidence of pulmonic stenosis. Aorta: The aortic root is normal in size and structure. Venous: The inferior vena cava is normal in size with greater than 50% respiratory variability, suggesting right atrial pressure of 3 mmHg. IAS/Shunts: No atrial level shunt detected by color flow Doppler.  LEFT VENTRICLE PLAX 2D LVIDd:         5.50 cm  Diastology LVIDs:         4.00 cm  LV e' medial:    7.83 cm/s LV PW:         1.00 cm  LV E/e' medial:  11.2 LV IVS:        0.80 cm  LV e' lateral:   8.49 cm/s LVOT diam:     2.30 cm  LV E/e' lateral: 10.3 LV SV:  101 LV SV Index:   53       2D Longitudinal Strain LVOT Area:     4.15 cm 2D Strain GLS Avg:     -15.6 %  RIGHT VENTRICLE RV Basal diam:  2.40 cm RV S prime:     12.20 cm/s LEFT ATRIUM             Index       RIGHT ATRIUM           Index LA diam:        3.40 cm 1.80 cm/m  RA Area:     10.70 cm LA Vol (A2C):   34.2 ml 18.11 ml/m RA Volume:   22.00 ml  11.65 ml/m LA Vol (A4C):   33.1 ml 17.53 ml/m LA Biplane Vol: 33.6 ml 17.80 ml/m  AORTIC VALVE                   PULMONIC VALVE AV Area (Vmax):    3.78 cm    PV Vmax:       0.75 m/s AV Area (Vmean):   3.74 cm    PV Vmean:      50.500 cm/s AV Area (VTI):     3.74 cm    PV VTI:        0.156 m AV Vmax:           133.00 cm/s PV Peak grad:  2.2 mmHg AV Vmean:          91.700 cm/s PV Mean grad:  1.0 mmHg AV VTI:            0.269 m AV Peak Grad:      7.1 mmHg AV Mean Grad:      4.0 mmHg LVOT Vmax:         121.00 cm/s LVOT Vmean:        82.500 cm/s LVOT VTI:          0.242 m LVOT/AV VTI ratio: 0.90  AORTA Ao Root diam: 2.80 cm MITRAL VALVE MV Area (PHT): 3.43 cm    SHUNTS MV Area VTI:   3.59 cm    Systemic VTI:  0.24 m MV Peak grad:  3.2 mmHg    Systemic Diam: 2.30 cm MV Mean grad:  1.0 mmHg MV  Vmax:       0.90 m/s MV Vmean:      55.7 cm/s MV Decel Time: 221 msec MV E velocity: 87.40 cm/s MV A velocity: 66.00 cm/s MV E/A ratio:  1.32 Lorine Bears MD Electronically signed by Lorine Bears MD Signature Date/Time: 05/03/2021/1:37:11 PM    Final      Subjective: No acute issues or events overnight   Discharge Exam: Vitals:   05/03/21 0450 05/03/21 0845  BP: 107/79 117/85  Pulse: 76 76  Resp: 18 16  Temp: 98.5 F (36.9 C) 97.7 F (36.5 C)  SpO2: 96% 99%   Vitals:   05/02/21 2200 05/03/21 0033 05/03/21 0450 05/03/21 0845  BP: 110/79 111/77 107/79 117/85  Pulse: 90 71 76 76  Resp: Temp: 98.3 F (36.8 C) 98.1 F (36.7 C) 98.5 F (36.9 C) 97.7 F (36.5 C)  TempSrc: Oral     SpO2: 100% 99% 96% 99%  Weight:      Height:        General: Pt is alert, awake, not in acute distress Cardiovascular: RRR, S1/S2 +, no rubs, no gallops Respiratory: CTA bilaterally, no wheezing, no rhonchi  Abdominal: Soft, NT, ND, bowel sounds + Extremities: no edema, no cyanosis    The results of significant diagnostics from this hospitalization (including imaging, microbiology, ancillary and laboratory) are listed below for reference.     Microbiology: Recent Results (from the past 240 hour(s))  Resp Panel by RT-PCR (Flu A&B, Covid) Nasopharyngeal Swab     Status: None   Collection Time: 05/02/21  1:04 PM   Specimen: Nasopharyngeal Swab; Nasopharyngeal(NP) swabs in vial transport medium  Result Value Ref Range Status   SARS Coronavirus 2 by RT PCR NEGATIVE NEGATIVE Final    Comment: (NOTE) SARS-CoV-2 target nucleic acids are NOT DETECTED.  The SARS-CoV-2 RNA is generally detectable in upper respiratory specimens during the acute phase of infection. The lowest concentration of SARS-CoV-2 viral copies this assay can detect is 138 copies/mL. A negative result does not preclude SARS-Cov-2 infection and should not be used as the sole basis for treatment or other patient  management decisions. A negative result may occur with  improper specimen collection/handling, submission of specimen other than nasopharyngeal swab, presence of viral mutation(s) within the areas targeted by this assay, and inadequate number of viral copies(<138 copies/mL). A negative result must be combined with clinical observations, patient history, and epidemiological information. The expected result is Negative.  Fact Sheet for Patients:  BloggerCourse.com  Fact Sheet for Healthcare Providers:  SeriousBroker.it  This test is no t yet approved or cleared by the [place] FDA and  has been authorized for detection and/or diagnosis of SARS-CoV-2 by FDA under an Emergency Use Authorization (EUA). This EUA will remain  in effect (meaning this test can be used) for the duration of the COVID-19 declaration under Section 564(b)(1) of the Act, 21 U.S.C.section 360bbb-3(b)(1), unless the authorization is terminated  or revoked sooner.       Influenza A by PCR NEGATIVE NEGATIVE Final   Influenza B by PCR NEGATIVE NEGATIVE Final    Comment: (NOTE) The Xpert Xpress SARS-CoV-2/FLU/RSV plus assay is intended as an aid in the diagnosis of influenza from Nasopharyngeal swab specimens and should not be used as a sole basis for treatment. Nasal washings and aspirates are unacceptable for Xpert Xpress SARS-CoV-2/FLU/RSV testing.  Fact Sheet for Patients: BloggerCourse.com  Fact Sheet for Healthcare Providers: SeriousBroker.it  This test is not yet approved or cleared by the [place] FDA and has been authorized for detection and/or diagnosis of SARS-CoV-2 by FDA under an Emergency Use Authorization (EUA). This EUA will remain in effect (meaning this test can be used) for the duration of the COVID-19 declaration under Section 564(b)(1) of the Act, 21 U.S.C. section 360bbb-3(b)(1),  unless the authorization is terminated or revoked.  Performed at [facility], [address] [place], [place] [address]      Labs: BNP (last 3 results) No results for input(s): BNP in the last 8760 hours. Basic Metabolic Panel: Recent Labs  Lab 05/02/21 0846  NA 139  K 3.9  CL 106  CO2 24  GLUCOSE 101*  BUN 13  CREATININE 0.76  CALCIUM 9.3   Liver Function Tests: Recent Labs  Lab 05/02/21 0846  AST 20  ALT 22  ALKPHOS 87  BILITOT 0.4  PROT 7.0  ALBUMIN 4.0   Recent Labs  Lab 05/02/21 0846  LIPASE 37   No results for input(s): AMMONIA in the last 168 hours. CBC: Recent Labs  Lab 05/02/21 0846  WBC 8.5  HGB 16.7  HCT 48.8  MCV 93.1  PLT 259  Cardiac Enzymes: No results for input(s): CKTOTAL, CKMB, CKMBINDEX, TROPONINI in the last 168 hours. BNP: Invalid input(s): POCBNP CBG: No results for input(s): GLUCAP in the last 168 hours. D-Dimer Recent Labs    05/02/21 1304  DDIMER 0.30   Hgb A1c No results for input(s): HGBA1C in the last 72 hours. Lipid Profile Recent Labs    05/03/21 0419  CHOL 177  HDL 38*  LDLCALC 113*  TRIG 128  CHOLHDL 4.7   Thyroid function studies Recent Labs    05/02/21 1524  TSH 1.476   Anemia work up Recent Labs    05/02/21 1524  VITAMINB12 359   Urinalysis    Component Value Date/Time   COLORURINE STRAW (A) 08/31/2016 1807   APPEARANCEUR Clear 11/14/2017 1126   LABSPEC 1.006 08/31/2016 1807   LABSPEC 1.016 12/23/2013 0157   PHURINE 7.0 08/31/2016 1807   GLUCOSEU Negative 11/14/2017 1126   GLUCOSEU Negative 12/23/2013 0157   HGBUR MODERATE (A) 08/31/2016 1807   BILIRUBINUR Negative 11/14/2017 1126   BILIRUBINUR Negative 12/23/2013 0157   KETONESUR NEGATIVE 08/31/2016 1807   PROTEINUR Negative 11/14/2017 1126   PROTEINUR NEGATIVE 08/31/2016 1807   UROBILINOGEN 0.2 09/13/2010 1908   NITRITE Negative 11/14/2017 1126   NITRITE NEGATIVE 08/31/2016 1807   LEUKOCYTESUR Negative 11/14/2017  1126   LEUKOCYTESUR Negative 12/23/2013 0157   Sepsis Labs Invalid input(s): PROCALCITONIN,  WBC,  LACTICIDVEN Microbiology Recent Results (from the past 240 hour(s))  Resp Panel by RT-PCR (Flu A&B, Covid) Nasopharyngeal Swab     Status: None   Collection Time: 05/02/21  1:04 PM   Specimen: Nasopharyngeal Swab; Nasopharyngeal(NP) swabs in vial transport medium  Result Value Ref Range Status   SARS Coronavirus 2 by RT PCR NEGATIVE NEGATIVE Final    Comment: (NOTE) SARS-CoV-2 target nucleic acids are NOT DETECTED.  The SARS-CoV-2 RNA is generally detectable in upper respiratory specimens during the acute phase of infection. The lowest concentration of SARS-CoV-2 viral copies this assay can detect is 138 copies/mL. A negative result does not preclude SARS-Cov-2 infection and should not be used as the sole basis for treatment or other patient management decisions. A negative result may occur with  improper specimen collection/handling, submission of specimen other than nasopharyngeal swab, presence of viral mutation(s) within the areas targeted by this assay, and inadequate number of viral copies(<138 copies/mL). A negative result must be combined with clinical observations, patient history, and epidemiological information. The expected result is Negative.  Fact Sheet for Patients:  BloggerCourse.com  Fact Sheet for Healthcare Providers:  SeriousBroker.it  This test is no t yet approved or cleared by the [place] FDA and  has been authorized for detection and/or diagnosis of SARS-CoV-2 by FDA under an Emergency Use Authorization (EUA). This EUA will remain  in effect (meaning this test can be used) for the duration of the COVID-19 declaration under Section 564(b)(1) of the Act, 21 U.S.C.section 360bbb-3(b)(1), unless the authorization is terminated  or revoked sooner.       Influenza A by PCR NEGATIVE NEGATIVE Final    Influenza B by PCR NEGATIVE NEGATIVE Final    Comment: (NOTE) The Xpert Xpress SARS-CoV-2/FLU/RSV plus assay is intended as an aid in the diagnosis of influenza from Nasopharyngeal swab specimens and should not be used as a sole basis for treatment. Nasal washings and aspirates are unacceptable for Xpert Xpress SARS-CoV-2/FLU/RSV testing.  Fact Sheet for Patients: BloggerCourse.com  Fact Sheet for Healthcare Providers: SeriousBroker.it  This test is not yet approved or cleared  by the [place] and has been authorized for detection and/or diagnosis of SARS-CoV-2 by FDA under an Emergency Use Authorization (EUA). This EUA will remain in effect (meaning this test can be used) for the duration of the COVID-19 declaration under Section 564(b)(1) of the Act, 21 U.S.C. section 360bbb-3(b)(1), unless the authorization is terminated or revoked.  Performed at [facility], [address] [place], [place] [address]      Time coordinating discharge: Over 30 minutes  SIGNED:   Azucena Fallen, DO [facility] 05/03/2021, 3:39 PM Pager   If 7PM-7AM, please contact night-coverage [email or web address]

## 2022-03-21 ENCOUNTER — Other Ambulatory Visit: Payer: Self-pay

## 2022-03-21 ENCOUNTER — Emergency Department: Payer: BC Managed Care – PPO

## 2022-03-21 ENCOUNTER — Emergency Department
Admission: EM | Admit: 2022-03-21 | Discharge: 2022-03-21 | Disposition: A | Discharge status: Home / Self Care | Payer: BC Managed Care – PPO | Attending: Emergency Medicine | Admitting: Emergency Medicine

## 2022-03-21 DIAGNOSIS — E876 Hypokalemia: Secondary | ICD-10-CM | POA: Insufficient documentation

## 2022-03-21 DIAGNOSIS — R1031 Right lower quadrant pain: Secondary | ICD-10-CM | POA: Diagnosis present

## 2022-03-21 DIAGNOSIS — I10 Essential (primary) hypertension: Secondary | ICD-10-CM | POA: Insufficient documentation

## 2022-03-21 DIAGNOSIS — N200 Calculus of kidney: Secondary | ICD-10-CM

## 2022-03-21 DIAGNOSIS — N132 Hydronephrosis with renal and ureteral calculous obstruction: Secondary | ICD-10-CM | POA: Diagnosis not present

## 2022-03-21 LAB — URINALYSIS, ROUTINE W REFLEX MICROSCOPIC
Bacteria, UA: NONE SEEN
Bilirubin Urine: NEGATIVE
Glucose, UA: NEGATIVE mg/dL
Ketones, ur: NEGATIVE mg/dL
Leukocytes,Ua: NEGATIVE
Nitrite: NEGATIVE
Protein, ur: 100 mg/dL — AB
RBC / HPF: >50 RBC/hpf — ABNORMAL HIGH (ref 0–5)
Specific Gravity, Urine: 1.025 (ref 1.005–1.030)
Squamous Epithelial / HPF: NONE SEEN (ref 0–5)
WBC, UA: NONE SEEN WBC/hpf (ref 0–5)
pH: 5 (ref 5.0–8.0)

## 2022-03-21 LAB — CBC
HCT: 45.1 % (ref 39.0–52.0)
Hemoglobin: 15.1 g/dL (ref 13.0–17.0)
MCH: 32.3 pg (ref 26.0–34.0)
MCHC: 33.5 g/dL (ref 30.0–36.0)
MCV: 96.4 fL (ref 80.0–100.0)
Platelets: 271 10*3/uL (ref 150–400)
RBC: 4.68 MIL/uL (ref 4.22–5.81)
RDW: 14.9 % (ref 11.5–15.5)
WBC: 8.5 10*3/uL (ref 4.0–10.5)
nRBC: 0 % (ref 0.0–0.2)

## 2022-03-21 LAB — BASIC METABOLIC PANEL
Anion gap: 8 (ref 5–15)
BUN: 16 mg/dL (ref 6–20)
CO2: 22 mmol/L (ref 22–32)
Calcium: 8.6 mg/dL — ABNORMAL LOW (ref 8.9–10.3)
Chloride: 114 mmol/L — ABNORMAL HIGH (ref 98–111)
Creatinine, Ser: 1 mg/dL (ref 0.61–1.24)
GFR, Estimated: >60 mL/min (ref >60)
Glucose, Bld: 112 mg/dL — ABNORMAL HIGH (ref 70–99)
Potassium: 2.9 mmol/L — ABNORMAL LOW (ref 3.5–5.1)
Sodium: 144 mmol/L (ref 135–145)

## 2022-03-21 MED ORDER — KETOROLAC TROMETHAMINE 10 MG PO TABS: 10.0000 mg | ORAL_TABLET | Freq: Four times a day (QID) | ORAL | 0 refills | Status: DC | PRN

## 2022-03-21 MED ORDER — ONDANSETRON 4 MG PO TBDP: 4.0000 mg | ORAL_TABLET | Freq: Three times a day (TID) | ORAL | 0 refills | Status: AC | PRN

## 2022-03-21 MED ORDER — POTASSIUM CHLORIDE CRYS ER 20 MEQ PO TBCR
20.0000 meq | EXTENDED_RELEASE_TABLET | Freq: Once | ORAL | Status: AC
  Administered 2022-03-21: 20 meq via ORAL
  Filled 2022-03-21: qty 1

## 2022-03-21 MED ORDER — SODIUM CHLORIDE 0.9 % IV BOLUS
1000.0000 mL | Freq: Once | INTRAVENOUS | Status: AC
  Administered 2022-03-21: 1000 mL via INTRAVENOUS

## 2022-03-21 MED ORDER — POTASSIUM CHLORIDE CRYS ER 20 MEQ PO TBCR
40.0000 meq | EXTENDED_RELEASE_TABLET | Freq: Once | ORAL | Status: AC
  Administered 2022-03-21: 40 meq via ORAL
  Filled 2022-03-21: qty 2

## 2022-03-21 MED ORDER — KETOROLAC TROMETHAMINE 30 MG/ML IJ SOLN
15.0000 mg | Freq: Once | INTRAMUSCULAR | Status: AC
  Administered 2022-03-21: 15 mg via INTRAVENOUS
  Filled 2022-03-21: qty 1

## 2022-03-21 MED ORDER — ONDANSETRON HCL 4 MG/2ML IJ SOLN
4.0000 mg | Freq: Once | INTRAMUSCULAR | Status: AC
  Administered 2022-03-21: 4 mg via INTRAVENOUS
  Filled 2022-03-21: qty 2

## 2022-03-21 NOTE — ED Triage Notes (Signed)
Pt comes with c/o flank pain right side. Pt states dark urine. Pt states vomiting and it all started today.

## 2022-03-21 NOTE — ED Notes (Signed)
See triage note  Presents with lower back pain and dark urine  States he noticed this this am    Now having some flank pain since

## 2022-03-21 NOTE — ED Provider Notes (Signed)
[facility] Provider Note    Event Date/Time   First MD Initiated Contact with Patient 03/21/22 1101     (approximate)   History   Chief Complaint Flank Pain   HPI  Douglas Barton is a 51 y.o. male with past medical history of hypertension, migraines, kidney stones who presents to the ED complaining of flank pain.  Patient reports that he had acute onset of pain in his right flank radiating down towards the right lower quadrant of his abdomen earlier this morning.  Pain has been constant since then and is described as sharp, not exacerbated or alleviated by anything in particular.  He has been feeling nauseous with multiple episodes of vomiting, states he has been unable to keep down even liquids.  He denies any associated diarrhea or pain in his abdomen.  He has noticed some dark urine with drops of blood in it, denies any dysuria or fevers.     Physical Exam   Triage Vital Signs: ED Triage Vitals  Enc Vitals Group     BP 03/21/22 0945 (!) 135/99     Pulse Rate 03/21/22 0945 68     Resp 03/21/22 0945 16     Temp 03/21/22 0945 97.6 F (36.4 C)     Temp Source 03/21/22 0945 Oral     SpO2 03/21/22 0945 93 %     Weight 03/21/22 0945 170 lb (77.1 kg)     Height 03/21/22 0945 5\' 8"  (1.727 m)     Head Circumference --      Peak Flow --      Pain Score 03/21/22 0940 5     Pain Loc --      Pain Edu? --      Excl. in GC? --     Most recent vital signs: Vitals:   03/21/22 0945 03/21/22 1317  BP: (!) 135/99 130/89  Pulse: 68 70  Resp: 16 16  Temp: 97.6 F (36.4 C) 98 F (36.7 C)  SpO2: 93% 94%    Constitutional: Alert and oriented. Eyes: Conjunctivae are normal. Head: Atraumatic. Nose: No congestion/rhinnorhea. Mouth/Throat: Mucous membranes are moist.  Cardiovascular: Normal rate, regular rhythm. Grossly normal heart sounds.  2+ radial pulses bilaterally. Respiratory: Normal respiratory effort.  No retractions. Lungs CTAB. Gastrointestinal:  Soft and nontender.  CVA tenderness noted on the right, no left CVA tenderness.  No distention. Musculoskeletal: No lower extremity tenderness nor edema.  Neurologic:  Normal speech and language. No gross focal neurologic deficits are appreciated.    ED Results / Procedures / Treatments   Labs (all labs ordered are listed, but only abnormal results are displayed) Labs Reviewed  URINALYSIS, ROUTINE W REFLEX MICROSCOPIC - Abnormal; Notable for the following components:      Result Value   Color, Urine RED (*)    APPearance CLOUDY (*)    Hgb urine dipstick LARGE (*)    Protein, ur 100 (*)    RBC / HPF >50 (*)    All other components within normal limits  BASIC METABOLIC PANEL - Abnormal; Notable for the following components:   Potassium 2.9 (*)    Chloride 114 (*)    Glucose, Bld 112 (*)    Calcium 8.6 (*)    All other components within normal limits  CBC   RADIOLOGY CT renal protocol reviewed and interpreted by me with small stone near the UPJ and associated hydronephrosis.  PROCEDURES:  Critical Care performed: No  Procedures  MEDICATIONS ORDERED IN ED: Medications  potassium chloride SA (KLOR-CON M) CR tablet 20 mEq (has no administration in time range)  ondansetron (ZOFRAN) injection 4 mg (4 mg Intravenous Given 03/21/22 1226)  ketorolac (TORADOL) 30 MG/ML injection 15 mg (15 mg Intravenous Given 03/21/22 1226)  sodium chloride 0.9 % bolus 1,000 mL (1,000 mLs Intravenous New Bag/Given 03/21/22 1226)  potassium chloride SA (KLOR-CON M) CR tablet 40 mEq (40 mEq Oral Given 03/21/22 1230)     IMPRESSION / MDM / ASSESSMENT AND PLAN / ED COURSE  I reviewed the triage vital signs and the nursing notes.                              51 y.o. male with past medical history of hypertension, migraines, and kidney stones who presents to the ED complaining of right flank pain radiating towards the right lower quadrant of his abdomen starting earlier this morning.  Patient's  presentation is most consistent with acute presentation with potential threat to life or bodily function.  Differential diagnosis includes, but is not limited to, kidney stone, pyelonephritis, appendicitis, cholecystitis.  Patient well-appearing and in no acute distress, vital signs are unremarkable however patient remains in significant pain with nausea.  Urinalysis shows blood but no signs of infection, symptoms seem most consistent with kidney stone and we will check CT renal protocol.  Labs thus far are remarkable for hypokalemia with no AKI, no significant anemia or leukocytosis noted.  We will treat symptomatically with IV Toradol and Zofran, hydrate with IV fluids.  CT scan remarkable for 2 mm stone near the right UPJ with associated hydronephrosis.  Patient feeling much better on reassessment with no ongoing nausea and minimal pain at this time.  He was given supplemental potassium and is appropriate for discharge home with outpatient follow-up with urology as needed.  He was counseled to return to the ED for new or worsening symptoms, patient agrees with plan.      FINAL CLINICAL IMPRESSION(S) / ED DIAGNOSES   Final diagnoses:  Kidney stone     Rx / DC Orders   ED Discharge Orders          Ordered    ketorolac (TORADOL) 10 MG tablet  Every 6 hours PRN        03/21/22 1400    ondansetron (ZOFRAN-ODT) 4 MG disintegrating tablet  Every 8 hours PRN        03/21/22 1400             Note:  This document was prepared using Dragon voice recognition software and may include unintentional dictation errors.   Chesley Noon, MD 03/21/22 1401

## 2022-03-30 ENCOUNTER — Encounter: Payer: Self-pay | Admitting: Urology

## 2022-03-30 ENCOUNTER — Ambulatory Visit (INDEPENDENT_AMBULATORY_CARE_PROVIDER_SITE_OTHER): Payer: BC Managed Care – PPO | Admitting: Urology

## 2022-03-30 VITALS — BP 160/90 | HR 82 | Ht 68.0 in | Wt 169.0 lb

## 2022-03-30 DIAGNOSIS — N200 Calculus of kidney: Secondary | ICD-10-CM | POA: Diagnosis not present

## 2022-03-30 NOTE — Progress Notes (Signed)
   03/30/22 12:33 PM   Douglas Barton 1971/09/02 [id number]  CC: Nephrolithiasis  HPI: 51 year old male who presented to the ER on 03/21/2022 with right-sided flank pain and CT showed a 2 mm right UPJ stone with mild hydronephrosis, and bilateral small nonobstructive renal stones.  He reports his pain has resolved over the last few days after an episode of gross hematuria and urinary frequency on 03/27/2022 where he thinks he passed his stone.  He has 1 prior stone episode that passed spontaneously.  He denies any prior surgery for kidney stones.  PMH: Past Medical History:  Diagnosis Date   Hypertension    Kidney stone     Surgical History: Past Surgical History:  Procedure Laterality Date   APPENDECTOMY     Family History: Family History  Problem Relation Age of Onset   Prostate cancer Neg Hx    Bladder Cancer Neg Hx    Kidney cancer Neg Hx     Social History:  reports that he has been smoking cigarettes. He has been smoking an average of 2 packs per day. He has never used smokeless tobacco. He reports that he does not drink alcohol and does not use drugs.  Physical Exam: BP (!) 160/90 (BP Location: Left Arm, Patient Position: Sitting, Cuff Size: Normal)   Pulse 82   Ht 5\' 8"  (1.727 m)   Wt 169 lb (76.7 kg)   BMI 25.70 kg/m    Constitutional:  Alert and oriented, No acute distress. Cardiovascular: No clubbing, cyanosis, or edema. Respiratory: Normal respiratory effort, no increased work of breathing. GI: Abdomen is soft, nontender, nondistended, no abdominal masses  Laboratory Data: Reviewed  Pertinent Imaging: I have personally viewed and interpreted the CT scan showing a 2 mm right UPJ stone with mild hydronephrosis and bilateral small nonobstructive renal stones.  Assessment & Plan:   51 year old male who presented to the ER on 03/21/2022 with right-sided flank pain and CT showed a 2 mm right UPJ stone.  We discussed the greater than 90% spontaneous passage rate  for small stones less than 5 mm.  His symptoms have resolved and stone has most likely passed spontaneously.  We discussed cannot confirm this 100% without repeat imaging, but with his lack of symptoms will defer further imaging at this time.  We discussed general stone prevention strategies including adequate hydration with goal of producing 2.5 L of urine daily, increasing citric acid intake, increasing calcium intake during high oxalate meals, minimizing animal protein, and decreasing salt intake. Information about dietary recommendations given today.   RTC 1 year KUB prior for stone surveillance  03/23/2022, MD 03/30/2022  [facility] [address] [place], [place] [phone]

## 2022-03-30 NOTE — Patient Instructions (Signed)
Dietary Guidelines to Help Prevent Kidney Stones Kidney stones are deposits of minerals and salts that form inside your kidneys. Your risk of developing kidney stones may be greater depending on your diet, your lifestyle, the medicines you take, and whether you have certain medical conditions. Most people can lower their chances of developing kidney stones by following the instructions below. Your dietitian may give you more specific instructions depending on your overall health and the type of kidney stones you tend to develop. What are tips for following this plan? Reading food labels  Choose foods with "no salt added" or "low-salt" labels. Limit your salt (sodium) intake to less than 1,500 mg a day. Choose foods with calcium for each meal and snack. Try to eat about 300 mg of calcium at each meal. Foods that contain 200-500 mg of calcium a serving include: 8 oz (237 mL) of milk, calcium-fortifiednon-dairy milk, and calcium-fortifiedfruit juice. Calcium-fortified means that calcium has been added to these drinks. 8 oz (237 mL) of kefir, yogurt, and soy yogurt. 4 oz (114 g) of tofu. 1 oz (28 g) of cheese. 1 cup (150 g) of dried figs. 1 cup (91 g) of cooked broccoli. One 3 oz (85 g) can of sardines or mackerel. Most people need 1,000-1,500 mg of calcium a day. Talk to your dietitian about how much calcium is recommended for you. Shopping Buy plenty of fresh fruits and vegetables. Most people do not need to avoid fruits and vegetables, even if these foods contain nutrients that may contribute to kidney stones. When shopping for convenience foods, choose: Whole pieces of fruit. Pre-made salads with dressing on the side. Low-fat fruit and yogurt smoothies. Avoid buying frozen meals or prepared deli foods. These can be high in sodium. Look for foods with live cultures, such as yogurt and kefir. Choose high-fiber grains, such as whole-wheat breads, oat bran, and wheat cereals. Cooking Do not add  salt to food when cooking. Place a salt shaker on the table and allow each person to add his or her own salt to taste. Use vegetable protein, such as beans, textured vegetable protein (TVP), or tofu, instead of meat in pasta, casseroles, and soups. Meal planning Eat less salt, if told by your dietitian. To do this: Avoid eating processed or pre-made food. Avoid eating fast food. Eat less animal protein, including cheese, meat, poultry, or fish, if told by your dietitian. To do this: Limit the number of times you have meat, poultry, fish, or cheese each week. Eat a diet free of meat at least 2 days a week. Eat only one serving each day of meat, poultry, fish, or seafood. When you prepare animal protein, cut pieces into small portion sizes. For most meat and fish, one serving is about the size of the palm of your hand. Eat at least five servings of fresh fruits and vegetables each day. To do this: Keep fruits and vegetables on hand for snacks. Eat one piece of fruit or a handful of berries with breakfast. Have a salad and fruit at lunch. Have two kinds of vegetables at dinner. Limit foods that are high in a substance called oxalate. These include: Spinach (cooked), rhubarb, beets, sweet potatoes, and Swiss chard. Peanuts. Potato chips, french fries, and baked potatoes with skin on. Nuts and nut products. Chocolate. If you regularly take a diuretic medicine, make sure to eat at least 1 or 2 servings of fruits or vegetables that are high in potassium each day. These include: Avocado. Banana. Orange, prune,   carrot, or tomato juice. Baked potato. Cabbage. Beans and split peas. Lifestyle  Drink enough fluid to keep your urine pale yellow. This is the most important thing you can do. Spread your fluid intake throughout the day. If you drink alcohol: Limit how much you use to: 0-1 drink a day for women who are not pregnant. 0-2 drinks a day for men. Be aware of how much alcohol is in your  drink. In the [place] one drink equals one 12 oz bottle of beer (355 mL), one 5 oz glass of wine (148 mL), or one 1 oz glass of hard liquor (44 mL). Lose weight if told by your health care provider. Work with your dietitian to find an eating plan and weight loss strategies that work best for you. General information Talk to your health care provider and dietitian about taking daily supplements. You may be told the following depending on your health and the cause of your kidney stones: Not to take supplements with vitamin C. To take a calcium supplement. To take a daily probiotic supplement. To take other supplements such as magnesium, fish oil, or vitamin B6. Take over-the-counter and prescription medicines only as told by your health care provider. These include supplements. What foods should I limit? Limit your intake of the following foods, or eat them as told by your dietitian. Vegetables Spinach. Rhubarb. Beets. Canned vegetables. Rosita Fire. Olives. Baked potatoes with skin. Grains Wheat bran. Baked goods. Salted crackers. Cereals high in sugar. Meats and other proteins Nuts. Nut butters. Large portions of meat, poultry, or fish. Salted, precooked, or cured meats, such as sausages, meat loaves, and hot dogs. Dairy Cheese. Beverages Regular soft drinks. Regular vegetable juice. Seasonings and condiments Seasoning blends with salt. Salad dressings. Soy sauce. Ketchup. Barbecue sauce. Other foods Canned soups. Canned pasta sauce. Casseroles. Pizza. Lasagna. Frozen meals. Potato chips. [place] fries. The items listed above may not be a complete list of foods and beverages you should limit. Contact a dietitian for more information. What foods should I avoid? Talk to your dietitian about specific foods you should avoid based on the type of kidney stones you have and your overall health. Fruits Grapefruit. The item listed above may not be a complete list of foods and beverages you should  avoid. Contact a dietitian for more information. Summary Kidney stones are deposits of minerals and salts that form inside your kidneys. You can lower your risk of kidney stones by making changes to your diet. The most important thing you can do is drink enough fluid. Drink enough fluid to keep your urine pale yellow. Talk to your dietitian about how much calcium you should have each day, and eat less salt and animal protein as told by your dietitian. This information is not intended to replace advice given to you by your health care provider. Make sure you discuss any questions you have with your health care provider. Document Revised: 05/03/2021 Document Reviewed: 05/03/2021 Elsevier Patient Education  2023 [employer].  Kidney Stones  Kidney stones are solid, rock-like deposits that form inside of the kidneys. The kidneys are a pair of organs that make urine. A kidney stone may form in a kidney and move into other parts of the urinary tract, including the tubes that connect the kidneys to the bladder (ureters), the bladder, and the tube that carries urine out of the body (urethra). As the stone moves through these areas, it can cause intense pain and block the flow of urine. Kidney stones are  created when high levels of certain minerals are found in the urine. The stones are usually passed out of the body through urination, but in some cases, medical treatment may be needed to remove them. What are the causes? Kidney stones may be caused by: A condition in which certain glands produce too much parathyroid hormone (primary hyperparathyroidism), which causes too much calcium buildup in the blood. A buildup of uric acid crystals in the bladder (hyperuricosuria). Uric acid is a chemical that the body produces when you eat certain foods. It usually exits the body in the urine. Narrowing (stricture) of one or both of the ureters. A kidney blockage that is present at birth (congenital  obstruction). Past surgery on the kidney or the ureters, such as gastric bypass surgery. What increases the risk? The following factors may make you more likely to develop this condition: Having had a kidney stone in the past. Having a family history of kidney stones. Not drinking enough water. Eating a diet that is high in protein, salt (sodium), or sugar. Being overweight or obese. What are the signs or symptoms? Symptoms of a kidney stone may include: Pain in the side of the abdomen, right below the ribs (flank pain). Pain usually spreads (radiates) to the groin. Needing to urinate frequently or urgently. Painful urination. Blood in the urine (hematuria). Nausea. Vomiting. Fever and chills. How is this diagnosed? This condition may be diagnosed based on: Your symptoms and medical history. A physical exam. Blood tests. Urine tests. These may be done before and after the stone passes out of your body through urination. Imaging tests, such as a CT scan, abdominal X-ray, or ultrasound. A procedure to examine the inside of the bladder (cystoscopy). How is this treated? Treatment for kidney stones depends on the size, location, and makeup of the stones. Kidney stones will often pass out of the body through urination. You may need to: Increase your fluid intake to help pass the stone. In some cases, you may be given fluids through an IV and may need to be monitored at the hospital. Take medicine for pain. Make changes in your diet to help prevent kidney stones from coming back. Sometimes, medical procedures are needed to remove a kidney stone. This may involve: A procedure to break up kidney stones using: A focused beam of light (laser therapy). Shock waves (extracorporeal shock wave lithotripsy). Surgery to remove kidney stones. This may be needed if you have severe pain or have stones that block your urinary tract. Follow these instructions at home: Medicines Take over-the-counter  and prescription medicines only as told by your health care provider. Ask your health care provider if the medicine prescribed to you requires you to avoid driving or using heavy machinery. Eating and drinking Drink enough fluid to keep your urine pale yellow. You may be instructed to drink at least 8-10 glasses of water each day. This will help you pass the kidney stone. If directed, change your diet. This may include: Limiting how much sodium you eat. Eating more fruits and vegetables. Limiting how much animal protein--such as red meat, poultry, fish, and eggs--you eat. Follow instructions from your health care provider about eating or drinking restrictions. General instructions Collect urine samples as told by your health care provider. You may need to collect a urine sample: 24 hours after you pass the stone. 8-12 weeks after passing the kidney stone, and every 6-12 months after that. Strain your urine every time you urinate, for as long as directed. Use  the strainer that your health care provider recommends. Do not throw out the kidney stone after passing it. Keep the stone so it can be tested by your health care provider. Testing the makeup of your kidney stone may help prevent you from getting kidney stones in the future. Keep all follow-up visits as told by your health care provider. This is important. You may need follow-up X-rays or ultrasounds to make sure that your stone has passed. How is this prevented? To prevent another kidney stone: Drink enough fluid to keep your urine pale yellow. This is the best way to prevent kidney stones. Eat a healthy diet and follow recommendations from your health care provider about foods to avoid. You may be instructed to eat a low-protein diet. Recommendations vary depending on the type of kidney stone that you have. Maintain a healthy weight. Where to find more information National Kidney Foundation (NKF): www.kidney.org [facility]  (UCF): www.urologyhealth.org Contact a health care provider if: You have pain that gets worse or does not get better with medicine. Get help right away if: You have a fever or chills. You develop severe pain. You develop new abdominal pain. You faint. You are unable to urinate. Summary Kidney stones are solid, rock-like deposits that form inside of the kidneys. Kidney stones can cause nausea, vomiting, blood in the urine, abdominal pain, and the urge to urinate frequently. Treatment for kidney stones depends on the size, location, and makeup of the stones. Kidney stones will often pass out of the body through urination. Kidney stones can be prevented by drinking enough fluids, eating a healthy diet, and maintaining a healthy weight. This information is not intended to replace advice given to you by your health care provider. Make sure you discuss any questions you have with your health care provider. Document Revised: 05/12/2021 Document Reviewed: 04/26/2021 Elsevier Patient Education  2023 Elsevier Inc.  

## 2022-08-04 ENCOUNTER — Other Ambulatory Visit: Payer: Self-pay | Admitting: Orthopedic Surgery

## 2022-08-04 DIAGNOSIS — M50123 Cervical disc disorder at C6-C7 level with radiculopathy: Secondary | ICD-10-CM

## 2022-08-04 DIAGNOSIS — M5412 Radiculopathy, cervical region: Secondary | ICD-10-CM

## 2022-08-17 ENCOUNTER — Ambulatory Visit
Admission: RE | Admit: 2022-08-17 | Discharge: 2022-08-17 | Disposition: A | Discharge status: Home / Self Care | Payer: BC Managed Care – PPO | Source: Ambulatory Visit | Attending: Orthopedic Surgery | Admitting: Orthopedic Surgery

## 2022-08-17 DIAGNOSIS — M50123 Cervical disc disorder at C6-C7 level with radiculopathy: Secondary | ICD-10-CM

## 2022-08-17 DIAGNOSIS — M5412 Radiculopathy, cervical region: Secondary | ICD-10-CM

## 2023-01-14 IMAGING — CR DG CHEST 2V
1 series · 2 of 2 positions shown · non-contrast
Comparison: March 25, 2015.

CLINICAL DATA: Chest pain.

EXAM:
CHEST - 2 VIEW

[Series 1: dg chest 2 view · 0.14mm/px · 2 of 2 slices shown]
[im 1/2]
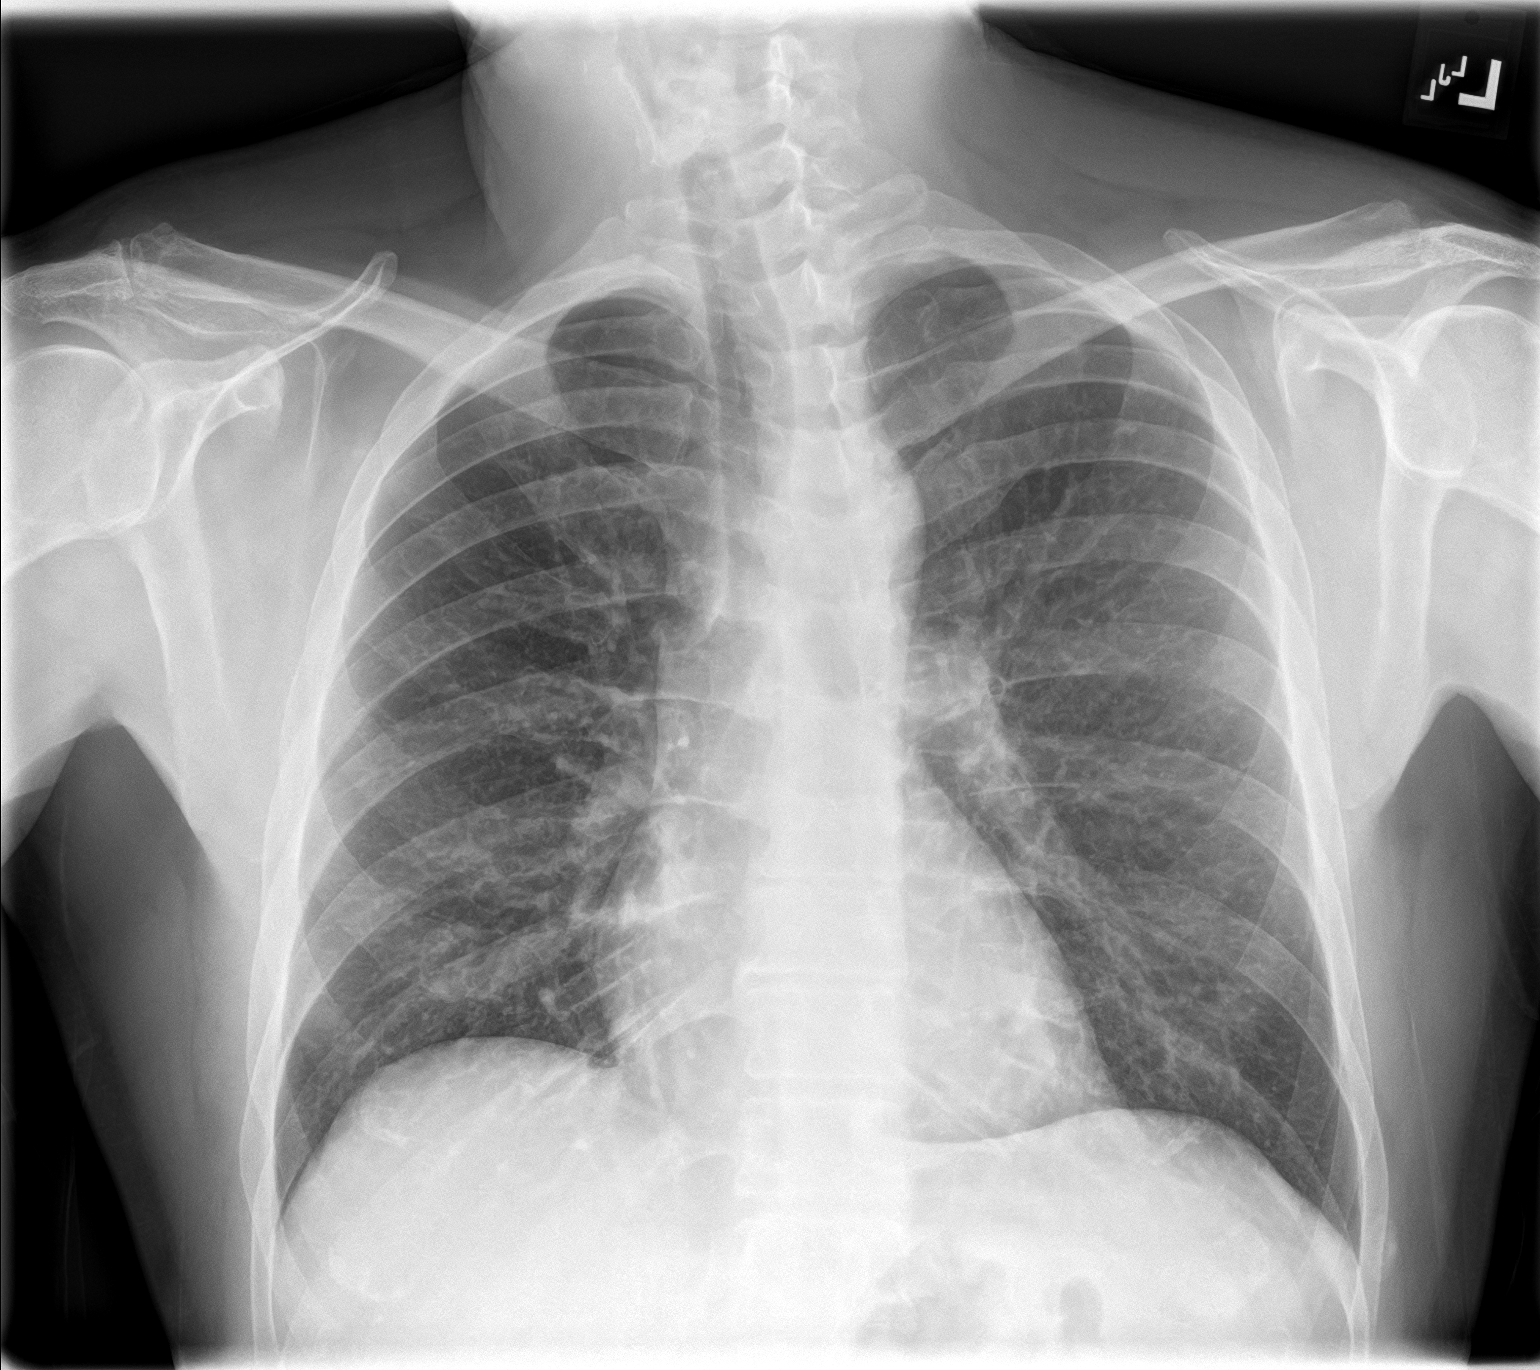
[im 2/2]
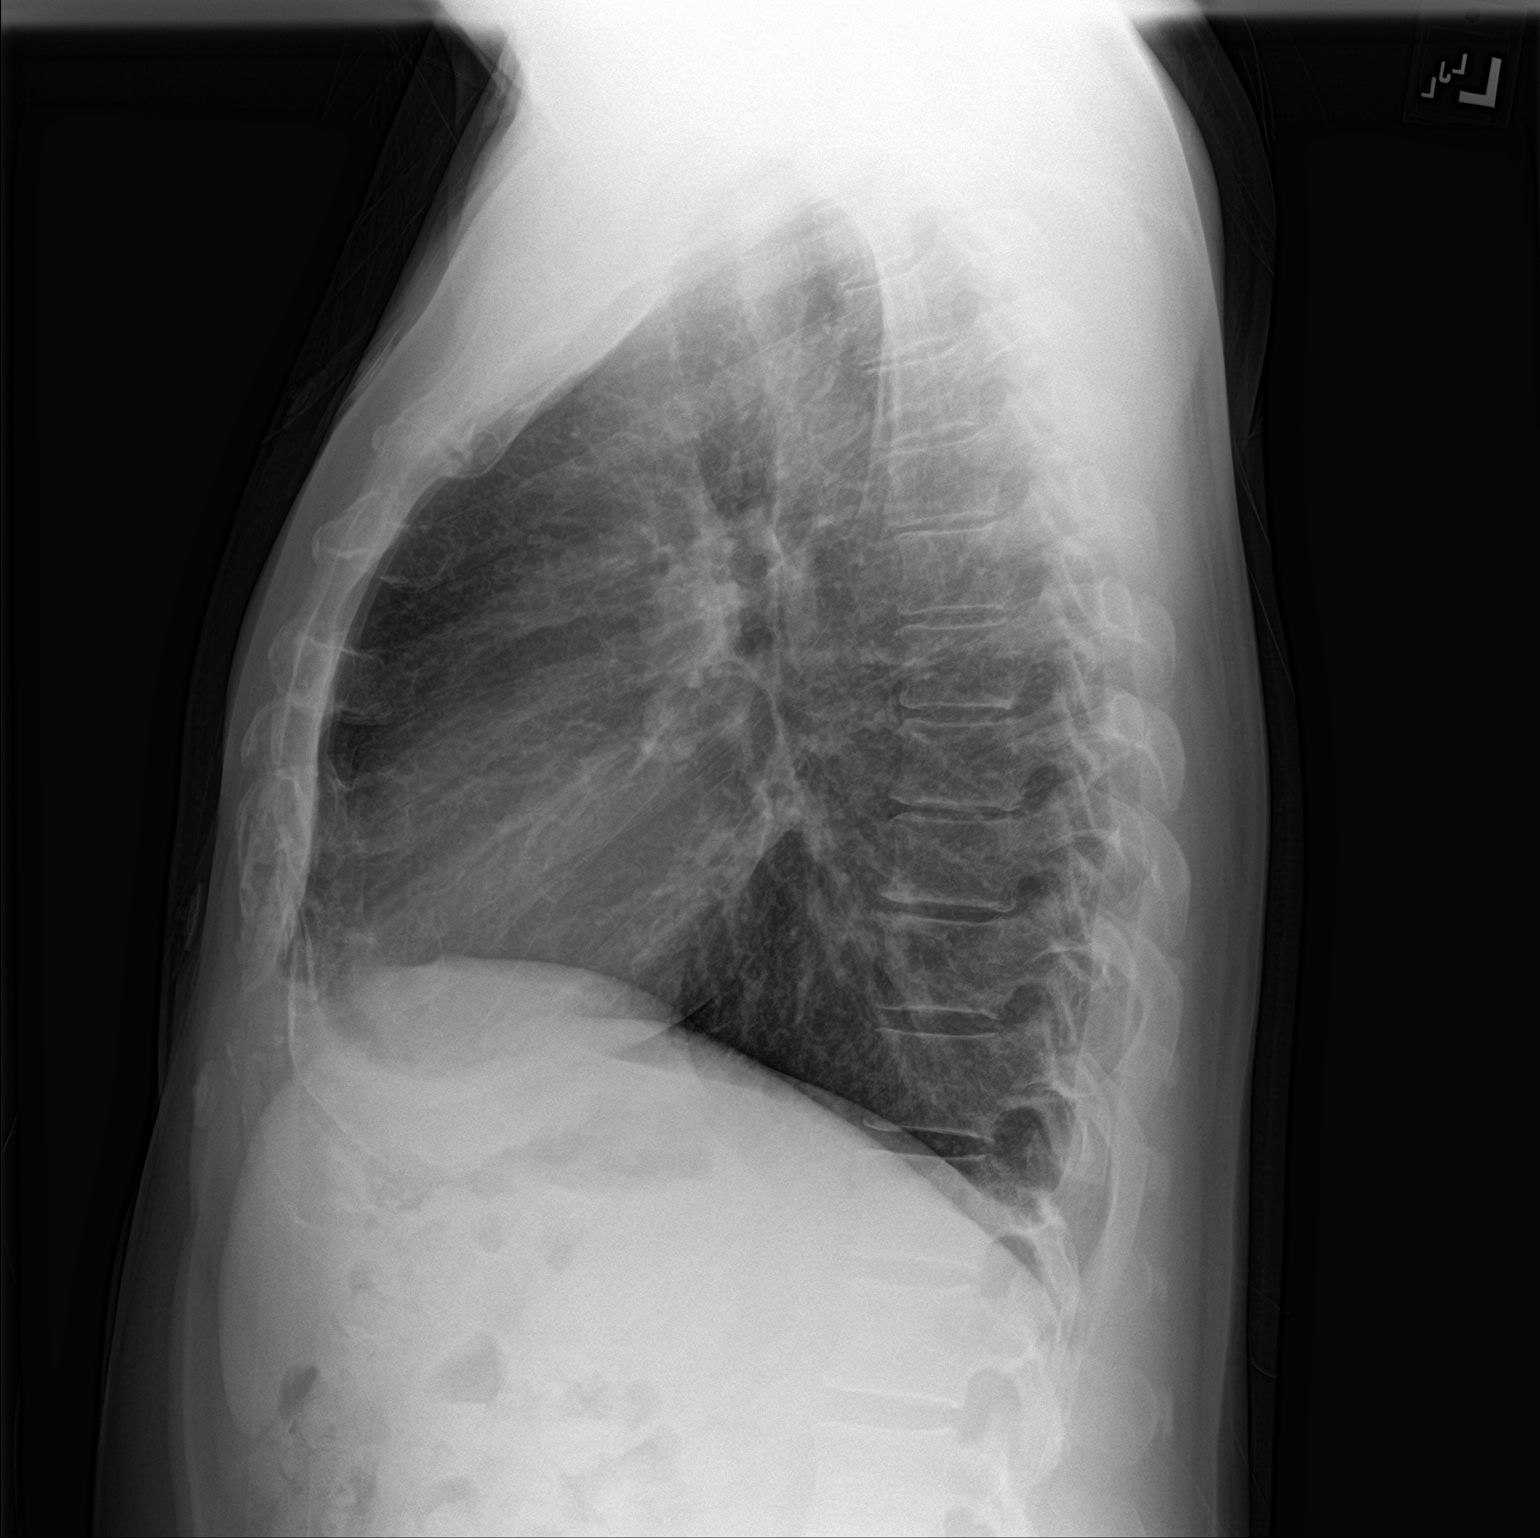

[2 of 2 positions shown; findings below may reference images not displayed]

FINDINGS: The heart size and mediastinal contours are within normal limits.
Both lungs are clear. The visualized skeletal structures are
unremarkable.
IMPRESSION: No active cardiopulmonary disease.

## 2023-04-04 ENCOUNTER — Ambulatory Visit: Payer: BC Managed Care – PPO | Admitting: Urology

## 2023-04-04 ENCOUNTER — Other Ambulatory Visit: Payer: Self-pay | Admitting: *Deleted

## 2023-04-04 DIAGNOSIS — N200 Calculus of kidney: Secondary | ICD-10-CM

## 2023-04-13 ENCOUNTER — Other Ambulatory Visit: Payer: Self-pay | Admitting: Internal Medicine

## 2023-04-13 DIAGNOSIS — R1084 Generalized abdominal pain: Secondary | ICD-10-CM

## 2023-04-13 DIAGNOSIS — R319 Hematuria, unspecified: Secondary | ICD-10-CM

## 2023-04-14 NOTE — Progress Notes (Signed)
04/17/2023 10:59 AM   Douglas Barton Feb 12, 1971 [id number]  Referring provider: Lauro Regulus, MD [address] [facility] [address] [place],  [place] [address]  Urological history: 1.  Nephrolithiasis -CT renal stone study (2023) -0.2 cm calculus in the right UPJ  Chief Complaint  Patient presents with   Nephrolithiasis   HPI: Douglas Barton is a 52 y.o. male who presents today for yearly visit.  Previous records reviewed.   Previous records reviewed.   He was recently seen by his PCP for left lower back pain and found to have microscopic hematuria.  He currently has a CT scan scheduled through his PCPs office on April 17, 2023.  His urine culture from that visit on April 09, 2023 was negative for infection.  He continues to pain radiates into his right groin associated with nausea.  His urine has been very yellow.  He denied fevers, chills or vomiting.  He also denied any gross hematuria.  He has a CT scan scheduled this afternoon.    Patient denies any modifying or aggravating factors.  Patient denies any recent UTI's, gross hematuria, dysuria or suprapubic pain.  Patient denies any fevers, chills, nausea or vomiting.   His CT renal stone study from this afternoon did not demonstrate any hydronephrosis or ureteral calculi.    PMH: Past Medical History:  Diagnosis Date   Hypertension    Kidney stone     Surgical History: Past Surgical History:  Procedure Laterality Date   APPENDECTOMY      Home Medications:  Allergies as of 04/17/2023       Reactions   Tramadol Nausea Only        Medication List        Accurate as of April 17, 2023 11:59 PM. If you have any questions, ask your nurse or doctor.          STOP taking these medications    ketorolac 10 MG tablet Commonly known as: TORADOL Stopped by: Michaeljoseph Revolorio       TAKE these medications    amLODipine 5 MG tablet Commonly known as: NORVASC Take 5 mg by mouth daily.    amphetamine-dextroamphetamine 15 MG tablet Commonly known as: ADDERALL Take 1 tablet by mouth 2 (two) times daily. What changed: Another medication with the same name was removed. Continue taking this medication, and follow the directions you see here. Changed by: Michiel Cowboy   aspirin EC 81 MG tablet Take 1 tablet (81 mg total) by mouth daily. Swallow whole.   atorvastatin 40 MG tablet Commonly known as: Lipitor Take 1 tablet (40 mg total) by mouth daily.   fluticasone-salmeterol 100-50 MCG/ACT Aepb Commonly known as: ADVAIR Inhale into the lungs.   ondansetron 4 MG disintegrating tablet Commonly known as: ZOFRAN-ODT Take 1 tablet (4 mg total) by mouth every 8 (eight) hours as needed for nausea or vomiting.   pantoprazole 40 MG tablet Commonly known as: PROTONIX Take 40 mg by mouth daily.   propranolol 20 MG tablet Commonly known as: INDERAL Take 1 tablet by mouth 2 (two) times daily.   topiramate 25 MG tablet Commonly known as: TOPAMAX Take 25 mg by mouth 2 (two) times daily.   venlafaxine 37.5 MG tablet Commonly known as: EFFEXOR Take 37.5 mg by mouth 2 (two) times daily.        Allergies:  Allergies  Allergen Reactions   Tramadol Nausea Only    Family History: Family History  Problem  Relation Age of Onset   Prostate cancer Neg Hx    Bladder Cancer Neg Hx    Kidney cancer Neg Hx     Social History:  reports that he has been smoking cigarettes. He has never used smokeless tobacco. He reports that he does not drink alcohol and does not use drugs.  ROS: Pertinent ROS in HPI  Physical Exam: BP (!) 151/90 (BP Location: Left Arm, Patient Position: Sitting, Cuff Size: Normal)   Pulse 76   Ht 5\' 7"  (1.702 m)   Wt 178 lb (80.7 kg)   BMI 27.88 kg/m   Constitutional:  Well nourished. Alert and oriented, No acute distress. HEENT: [place] AT, moist mucus membranes.  Trachea midline Cardiovascular: No clubbing, cyanosis, or edema. Respiratory: Normal  respiratory effort, no increased work of breathing. Neurologic: Grossly intact, no focal deficits, moving all 4 extremities. Psychiatric: Normal mood and affect.  Laboratory Data: Comprehensive Metabolic Panel (CMP) Order: [id number] Component Ref Range & Units 3 d ago  Glucose 70 - 110 mg/dL 89  Sodium 409 - 811 mmol/L 141  Potassium 3.6 - 5.1 mmol/L 3.7  Chloride 97 - 109 mmol/L 107  Carbon Dioxide (CO2) 22.0 - 32.0 mmol/L 26.9  Urea Nitrogen (BUN) 7 - 25 mg/dL 24  Creatinine 0.7 - 1.3 mg/dL 1.0  Glomerular Filtration Rate (eGFR) >60 mL/min/1.73sq m 90  Comment: CKD-EPI (2021) does not include patient's race in the calculation of eGFR.  Monitoring changes of plasma creatinine and eGFR over time is useful for monitoring kidney function.  Interpretive Ranges for eGFR (CKD-EPI 2021):  eGFR:       >60 mL/min/1.73 sq. m - Normal eGFR:       30-59 mL/min/1.73 sq. m - Moderately Decreased eGFR:       15-29 mL/min/1.73 sq. m  - Severely Decreased eGFR:       < 15 mL/min/1.73 sq. m  - Kidney Failure   Note: These eGFR calculations do not apply in acute situations when eGFR is changing rapidly or patients on dialysis.  Calcium 8.7 - 10.3 mg/dL 9.2  AST 8 - 39 U/L 19  ALT 6 - 57 U/L 17  Alk Phos (alkaline Phosphatase) 34 - 104 U/L 62  Albumin 3.5 - 4.8 g/dL 4.5  Bilirubin, Total 0.3 - 1.2 mg/dL 0.2 Low   Protein, Total 6.1 - 7.9 g/dL 6.4  A/G Ratio 1.0 - 5.0 gm/dL 2.4  Resulting Agency [facility]   Specimen Collected: 04/11/23 16:45   Performed by: [facility] Last Resulted: 04/11/23 17:55  Received From: [facility]  Result Received: 04/13/23 11:27    Urinalysis Urinalysis w/Microscopic Order: [id number] Component Ref Range & Units 3 d ago  Color Colorless, Straw, Light Yellow, Yellow, Dark Yellow Yellow  Clarity Clear Clear  Specific Gravity 1.005 - 1.030 >1.030 High   pH, Urine 5.0 - 8.0 5.5  Protein,  Urinalysis Negative mg/dL Trace Abnormal   Glucose, Urinalysis Negative mg/dL Negative  Ketones, Urinalysis Negative mg/dL Negative  Blood, Urinalysis Negative 2+ Abnormal   Nitrite, Urinalysis Negative Negative  Leukocyte Esterase, Urinalysis Negative Negative  Bilirubin, Urinalysis Negative Negative  Urobilinogen, Urinalysis 0.2 - 1.0 mg/dL 2.0 High   Mucous, Urine None Seen PRESENT Abnormal   WBC, UA <=5 /hpf 2  Red Blood Cells, Urinalysis <=3 /hpf 12 High   Bacteria, Urinalysis 0 - 5 /hpf 0-5  Squamous Epithelial Cells, Urinalysis /hpf 11  Resulting Agency [facility]  Specimen Collected: 04/11/23 16:45   Performed by: [facility] Last Resulted: 04/11/23 17:30  Received From: [facility]  Result Received: 04/13/23 11:27    Hemoglobin A1C Order: [id number] Component Ref Range & Units 3 wk ago  Hemoglobin A1C 4.2 - 5.6 % 6.7 High   Average Blood Glucose (Calc) mg/dL 409  Resulting Agency [facility]  Narrative Performed by [facility] Normal Range:    4.2 - 5.6% Increased Risk:  5.7 - 6.4% Diabetes:        >= 6.5% Glycemic Control for adults with diabetes:  <7%    Specimen Collected: 03/20/23 07:33   Performed by: [facility] Resulted: 03/20/23 08:37  Received From: [facility]  Result Received: 04/04/23 08:27  I have reviewed the labs.   Pertinent Imaging: CLINICAL DATA:  Generalized abdominal pain, unspecified. Concern for renal stone. Hematuria   EXAM: CT ABDOMEN AND PELVIS WITHOUT CONTRAST   TECHNIQUE: Multidetector CT imaging of the abdomen and pelvis was performed following the standard protocol without IV contrast.   RADIATION DOSE REDUCTION: This exam was performed according to the departmental dose-optimization program which includes automated exposure control, adjustment of the mA and/or kV according to patient size and/or  use of iterative reconstruction technique.   COMPARISON:  Multiple priors including CT March 21, 2022   FINDINGS: Lower chest: No acute abnormality.   Hepatobiliary: Unremarkable noncontrast enhanced appearance of the hepatic parenchyma. Gallbladder is unremarkable. No biliary ductal dilation.   Pancreas: No pancreatic ductal dilation or evidence of acute inflammation.   Spleen: No splenomegaly.   Adrenals/Urinary Tract: Bilateral adrenal glands appear normal. No hydronephrosis. Punctate nonobstructive right renal stones measure up to 2-3 mm. No obstructive ureteral or bladder calculi. Stable 16 mm partially exophytic left interpolar renal lesion previously characterized as a cyst on CT September 28, 2016 and considered benign requiring no independent imaging follow-up.   Stomach/Bowel: Stomach is unremarkable for degree of distension. No pathologic dilation of small or large bowel. No evidence of acute bowel inflammation.   Vascular/Lymphatic: Aortic atherosclerosis. Normal caliber abdominal aorta. Smooth IVC contours. No pathologically enlarged abdominal or pelvic lymph nodes   Reproductive: Prostate is unremarkable.   Other: No significant abdominopelvic free fluid.   Musculoskeletal: Lower lumbar discogenic disease.   IMPRESSION: 1. Punctate nonobstructive right renal stones measure up to 2-3 mm. No obstructive ureteral or bladder calculi. 2.  Aortic Atherosclerosis (ICD10-I70.0).     Electronically Signed   By: Maudry Mayhew M.D.   On: 04/21/2023 15:25 I have independently reviewed the films.   See HPI.    Assessment & Plan:    1.  Nephrolithiasis -CT renal stone study did not demonstrate any hydronephrosis or ureteral stones -explained that stones that lie within the kidney that are not causing obstruction did not typically cause pain, so he needs to consider other eitologies for his flank pain, so he will need to reach out to his PCP  2. Microscopic  hematuria -UA at PCP's office w/ 12 RBC's -this may be due to his renal stones, but with his history of smoking he is at higher risk for bladder cancer  -will need to be scheduled for cysto   Return for cysto .  These notes generated with voice recognition software. I apologize for typographical errors.  Michiel Cowboy, PA-C  [facility] [address]  [address] [place], [place] [address] [phone]  [phone]  

## 2023-04-17 ENCOUNTER — Encounter: Payer: Self-pay | Admitting: Urology

## 2023-04-17 ENCOUNTER — Ambulatory Visit: Payer: BC Managed Care – PPO | Admitting: Urology

## 2023-04-17 ENCOUNTER — Ambulatory Visit
Admission: RE | Admit: 2023-04-17 | Discharge: 2023-04-17 | Disposition: A | Discharge status: Home / Self Care | Payer: BC Managed Care – PPO | Source: Ambulatory Visit | Attending: Internal Medicine | Admitting: Internal Medicine

## 2023-04-17 VITALS — BP 151/90 | HR 76 | Ht 67.0 in | Wt 178.0 lb

## 2023-04-17 DIAGNOSIS — R109 Unspecified abdominal pain: Secondary | ICD-10-CM | POA: Diagnosis not present

## 2023-04-17 DIAGNOSIS — R1084 Generalized abdominal pain: Secondary | ICD-10-CM | POA: Diagnosis not present

## 2023-04-17 DIAGNOSIS — R3129 Other microscopic hematuria: Secondary | ICD-10-CM | POA: Diagnosis not present

## 2023-04-17 DIAGNOSIS — N200 Calculus of kidney: Secondary | ICD-10-CM

## 2023-04-17 DIAGNOSIS — R319 Hematuria, unspecified: Secondary | ICD-10-CM | POA: Diagnosis present

## 2023-04-18 ENCOUNTER — Telehealth: Payer: Self-pay

## 2023-04-18 NOTE — Telephone Encounter (Signed)
Patient called requesting results of CT scan with [facility]
